# Patient Record
Sex: Male | Born: 1935 | Race: White | Hispanic: No | Marital: Married | Smoking: Former smoker
Health system: Southern US, Community
[De-identification: ages and names within clinical notes are randomized; demographics above are authoritative.]

## PROBLEM LIST (undated history)

## (undated) DIAGNOSIS — M1990 Unspecified osteoarthritis, unspecified site: Secondary | ICD-10-CM

## (undated) DIAGNOSIS — N289 Disorder of kidney and ureter, unspecified: Secondary | ICD-10-CM

## (undated) DIAGNOSIS — J449 Chronic obstructive pulmonary disease, unspecified: Secondary | ICD-10-CM

## (undated) DIAGNOSIS — E785 Hyperlipidemia, unspecified: Secondary | ICD-10-CM

## (undated) DIAGNOSIS — I10 Essential (primary) hypertension: Secondary | ICD-10-CM

## (undated) DIAGNOSIS — C329 Malignant neoplasm of larynx, unspecified: Secondary | ICD-10-CM

## (undated) HISTORY — PX: APPENDECTOMY: SHX54

## (undated) HISTORY — PX: PROSTATE SURGERY: SHX751

## (undated) HISTORY — DX: Disorder of kidney and ureter, unspecified: N28.9

## (undated) HISTORY — DX: Hyperlipidemia, unspecified: E78.5

## (undated) HISTORY — PX: TONSILLECTOMY AND ADENOIDECTOMY: SHX28

## (undated) HISTORY — DX: Unspecified osteoarthritis, unspecified site: M19.90

## (undated) HISTORY — DX: Chronic obstructive pulmonary disease, unspecified: J44.9

## (undated) HISTORY — DX: Malignant neoplasm of larynx, unspecified: C32.9

## (undated) HISTORY — PX: AMPUTATION FINGER / THUMB: SUR24

## (undated) HISTORY — DX: Essential (primary) hypertension: I10

---

## 1992-04-19 HISTORY — PX: CATARACT EXTRACTION: SUR2

## 2004-04-29 ENCOUNTER — Ambulatory Visit: Payer: Self-pay | Admitting: Internal Medicine

## 2005-04-12 ENCOUNTER — Emergency Department: Payer: Self-pay | Admitting: Emergency Medicine

## 2005-07-14 ENCOUNTER — Ambulatory Visit: Payer: Self-pay | Admitting: Internal Medicine

## 2005-08-11 ENCOUNTER — Encounter: Payer: Self-pay | Admitting: Internal Medicine

## 2005-08-16 ENCOUNTER — Ambulatory Visit: Payer: Self-pay | Admitting: Internal Medicine

## 2005-10-13 ENCOUNTER — Ambulatory Visit: Payer: Self-pay | Admitting: Internal Medicine

## 2005-10-14 ENCOUNTER — Emergency Department: Payer: Self-pay | Admitting: Unknown Physician Specialty

## 2005-10-29 ENCOUNTER — Emergency Department: Payer: Self-pay | Admitting: Urology

## 2005-11-08 ENCOUNTER — Emergency Department: Payer: Self-pay

## 2005-12-01 ENCOUNTER — Ambulatory Visit: Payer: Self-pay | Admitting: Urology

## 2005-12-01 ENCOUNTER — Other Ambulatory Visit: Payer: Self-pay

## 2005-12-06 ENCOUNTER — Ambulatory Visit: Payer: Self-pay | Admitting: Internal Medicine

## 2005-12-08 ENCOUNTER — Ambulatory Visit: Payer: Self-pay | Admitting: Urology

## 2006-02-02 ENCOUNTER — Encounter
Admission: RE | Admit: 2006-02-02 | Discharge: 2006-02-02 | Payer: Self-pay | Admitting: Physical Medicine and Rehabilitation

## 2006-02-02 ENCOUNTER — Encounter: Payer: Self-pay | Admitting: Internal Medicine

## 2006-02-15 ENCOUNTER — Ambulatory Visit: Payer: Self-pay | Admitting: Internal Medicine

## 2006-03-21 ENCOUNTER — Observation Stay (HOSPITAL_COMMUNITY): Admission: RE | Admit: 2006-03-21 | Discharge: 2006-03-21 | Payer: Self-pay | Admitting: Neurosurgery

## 2006-03-24 ENCOUNTER — Ambulatory Visit: Payer: Self-pay | Admitting: Otolaryngology

## 2006-03-25 ENCOUNTER — Ambulatory Visit: Payer: Self-pay | Admitting: Otolaryngology

## 2006-03-25 ENCOUNTER — Ambulatory Visit: Payer: Self-pay | Admitting: Internal Medicine

## 2006-03-29 ENCOUNTER — Ambulatory Visit: Payer: Self-pay | Admitting: Otolaryngology

## 2006-04-08 ENCOUNTER — Ambulatory Visit: Payer: Self-pay | Admitting: Radiation Oncology

## 2006-04-14 ENCOUNTER — Ambulatory Visit: Payer: Self-pay | Admitting: Radiation Oncology

## 2006-04-19 ENCOUNTER — Ambulatory Visit: Payer: Self-pay | Admitting: Radiation Oncology

## 2006-04-19 DIAGNOSIS — C329 Malignant neoplasm of larynx, unspecified: Secondary | ICD-10-CM

## 2006-04-19 HISTORY — DX: Malignant neoplasm of larynx, unspecified: C32.9

## 2006-04-21 ENCOUNTER — Encounter: Payer: Self-pay | Admitting: Internal Medicine

## 2006-05-20 ENCOUNTER — Ambulatory Visit: Payer: Self-pay | Admitting: Radiation Oncology

## 2006-06-18 ENCOUNTER — Ambulatory Visit: Payer: Self-pay | Admitting: Radiation Oncology

## 2006-06-18 ENCOUNTER — Ambulatory Visit: Payer: Self-pay | Admitting: Oncology

## 2006-07-19 ENCOUNTER — Ambulatory Visit: Payer: Self-pay | Admitting: Radiation Oncology

## 2006-07-19 ENCOUNTER — Ambulatory Visit: Payer: Self-pay | Admitting: Oncology

## 2006-07-27 ENCOUNTER — Encounter: Payer: Self-pay | Admitting: Internal Medicine

## 2006-07-27 DIAGNOSIS — J449 Chronic obstructive pulmonary disease, unspecified: Secondary | ICD-10-CM

## 2006-07-27 DIAGNOSIS — I10 Essential (primary) hypertension: Secondary | ICD-10-CM | POA: Insufficient documentation

## 2006-07-27 DIAGNOSIS — N259 Disorder resulting from impaired renal tubular function, unspecified: Secondary | ICD-10-CM | POA: Insufficient documentation

## 2006-07-27 DIAGNOSIS — Z87898 Personal history of other specified conditions: Secondary | ICD-10-CM

## 2006-07-27 DIAGNOSIS — J4489 Other specified chronic obstructive pulmonary disease: Secondary | ICD-10-CM | POA: Insufficient documentation

## 2006-08-17 ENCOUNTER — Ambulatory Visit: Payer: Self-pay | Admitting: Internal Medicine

## 2006-08-18 ENCOUNTER — Ambulatory Visit: Payer: Self-pay | Admitting: Oncology

## 2006-08-18 ENCOUNTER — Ambulatory Visit: Payer: Self-pay | Admitting: Radiation Oncology

## 2006-08-23 ENCOUNTER — Encounter: Payer: Self-pay | Admitting: Internal Medicine

## 2006-09-18 ENCOUNTER — Ambulatory Visit: Payer: Self-pay | Admitting: Oncology

## 2006-09-18 ENCOUNTER — Ambulatory Visit: Payer: Self-pay | Admitting: Radiation Oncology

## 2006-10-18 ENCOUNTER — Ambulatory Visit: Payer: Self-pay | Admitting: Radiation Oncology

## 2006-10-26 ENCOUNTER — Ambulatory Visit: Payer: Self-pay | Admitting: Urology

## 2006-10-28 ENCOUNTER — Ambulatory Visit: Payer: Self-pay | Admitting: Oncology

## 2006-11-07 ENCOUNTER — Encounter (INDEPENDENT_AMBULATORY_CARE_PROVIDER_SITE_OTHER): Payer: Self-pay | Admitting: *Deleted

## 2006-11-18 ENCOUNTER — Ambulatory Visit: Payer: Self-pay | Admitting: Radiation Oncology

## 2006-12-09 ENCOUNTER — Ambulatory Visit: Payer: Self-pay | Admitting: Oncology

## 2006-12-14 ENCOUNTER — Encounter (INDEPENDENT_AMBULATORY_CARE_PROVIDER_SITE_OTHER): Payer: Self-pay | Admitting: *Deleted

## 2006-12-14 ENCOUNTER — Ambulatory Visit: Payer: Self-pay | Admitting: Oncology

## 2006-12-19 ENCOUNTER — Ambulatory Visit: Payer: Self-pay | Admitting: Oncology

## 2006-12-19 ENCOUNTER — Ambulatory Visit: Payer: Self-pay | Admitting: Radiation Oncology

## 2007-01-04 ENCOUNTER — Ambulatory Visit: Payer: Self-pay | Admitting: Internal Medicine

## 2007-01-04 DIAGNOSIS — M1990 Unspecified osteoarthritis, unspecified site: Secondary | ICD-10-CM | POA: Insufficient documentation

## 2007-01-04 DIAGNOSIS — E785 Hyperlipidemia, unspecified: Secondary | ICD-10-CM

## 2007-01-10 ENCOUNTER — Encounter: Payer: Self-pay | Admitting: Internal Medicine

## 2007-01-18 HISTORY — PX: LARYNGECTOMY: SUR815

## 2007-01-23 ENCOUNTER — Telehealth: Payer: Self-pay | Admitting: Internal Medicine

## 2007-02-05 ENCOUNTER — Encounter (INDEPENDENT_AMBULATORY_CARE_PROVIDER_SITE_OTHER): Payer: Self-pay | Admitting: *Deleted

## 2007-02-17 ENCOUNTER — Encounter: Payer: Self-pay | Admitting: Internal Medicine

## 2007-02-18 ENCOUNTER — Ambulatory Visit: Payer: Self-pay | Admitting: Oncology

## 2007-02-26 ENCOUNTER — Ambulatory Visit: Payer: Self-pay | Admitting: Family Medicine

## 2007-03-08 ENCOUNTER — Ambulatory Visit: Payer: Self-pay | Admitting: Oncology

## 2007-03-20 ENCOUNTER — Ambulatory Visit: Payer: Self-pay | Admitting: Oncology

## 2007-03-29 ENCOUNTER — Encounter: Payer: Self-pay | Admitting: Internal Medicine

## 2007-04-07 ENCOUNTER — Ambulatory Visit: Payer: Self-pay | Admitting: Internal Medicine

## 2007-04-19 ENCOUNTER — Encounter: Payer: Self-pay | Admitting: Internal Medicine

## 2007-05-04 ENCOUNTER — Telehealth (INDEPENDENT_AMBULATORY_CARE_PROVIDER_SITE_OTHER): Payer: Self-pay | Admitting: *Deleted

## 2007-07-07 ENCOUNTER — Ambulatory Visit: Payer: Self-pay | Admitting: Internal Medicine

## 2007-07-07 DIAGNOSIS — R32 Unspecified urinary incontinence: Secondary | ICD-10-CM

## 2007-07-11 LAB — CONVERTED CEMR LAB
Basophils Relative: 0.9 % (ref 0.0–1.0)
CO2: 29 meq/L (ref 19–32)
Creatinine, Ser: 1.3 mg/dL (ref 0.4–1.5)
Eosinophils Relative: 2.1 % (ref 0.0–5.0)
HCT: 39.8 % (ref 39.0–52.0)
Hemoglobin: 13.2 g/dL (ref 13.0–17.0)
Lymphocytes Relative: 13.4 % (ref 12.0–46.0)
Monocytes Absolute: 0.5 10*3/uL (ref 0.2–0.7)
Neutro Abs: 3.7 10*3/uL (ref 1.4–7.7)
Phosphorus: 3.9 mg/dL (ref 2.3–4.6)
Sodium: 133 meq/L — ABNORMAL LOW (ref 135–145)
TSH: 3.77 microintl units/mL (ref 0.35–5.50)
WBC: 5 10*3/uL (ref 4.5–10.5)

## 2007-07-19 ENCOUNTER — Ambulatory Visit: Payer: Self-pay | Admitting: Oncology

## 2007-07-21 ENCOUNTER — Encounter (INDEPENDENT_AMBULATORY_CARE_PROVIDER_SITE_OTHER): Payer: Self-pay | Admitting: *Deleted

## 2007-08-22 ENCOUNTER — Telehealth (INDEPENDENT_AMBULATORY_CARE_PROVIDER_SITE_OTHER): Payer: Self-pay | Admitting: *Deleted

## 2007-08-23 ENCOUNTER — Encounter: Payer: Self-pay | Admitting: Internal Medicine

## 2007-09-27 ENCOUNTER — Telehealth: Payer: Self-pay | Admitting: Internal Medicine

## 2007-10-03 ENCOUNTER — Encounter: Payer: Self-pay | Admitting: Internal Medicine

## 2007-11-02 ENCOUNTER — Telehealth (INDEPENDENT_AMBULATORY_CARE_PROVIDER_SITE_OTHER): Payer: Self-pay | Admitting: *Deleted

## 2007-11-10 ENCOUNTER — Ambulatory Visit: Payer: Self-pay | Admitting: Internal Medicine

## 2007-11-24 ENCOUNTER — Telehealth (INDEPENDENT_AMBULATORY_CARE_PROVIDER_SITE_OTHER): Payer: Self-pay | Admitting: *Deleted

## 2008-01-03 ENCOUNTER — Telehealth: Payer: Self-pay | Admitting: Internal Medicine

## 2008-01-09 ENCOUNTER — Encounter: Payer: Self-pay | Admitting: Internal Medicine

## 2008-02-13 ENCOUNTER — Telehealth: Payer: Self-pay | Admitting: Internal Medicine

## 2008-03-11 ENCOUNTER — Telehealth: Payer: Self-pay | Admitting: Internal Medicine

## 2008-04-01 ENCOUNTER — Telehealth: Payer: Self-pay | Admitting: Internal Medicine

## 2008-04-18 ENCOUNTER — Ambulatory Visit: Payer: Self-pay | Admitting: Family Medicine

## 2008-04-18 DIAGNOSIS — J019 Acute sinusitis, unspecified: Secondary | ICD-10-CM

## 2008-04-22 ENCOUNTER — Telehealth: Payer: Self-pay | Admitting: Family Medicine

## 2008-05-16 ENCOUNTER — Ambulatory Visit: Payer: Self-pay | Admitting: Internal Medicine

## 2008-05-16 DIAGNOSIS — R930 Abnormal findings on diagnostic imaging of skull and head, not elsewhere classified: Secondary | ICD-10-CM | POA: Insufficient documentation

## 2008-06-10 ENCOUNTER — Telehealth: Payer: Self-pay | Admitting: Internal Medicine

## 2008-06-21 ENCOUNTER — Ambulatory Visit: Payer: Self-pay | Admitting: Internal Medicine

## 2008-07-10 ENCOUNTER — Telehealth: Payer: Self-pay | Admitting: Internal Medicine

## 2008-08-09 ENCOUNTER — Telehealth: Payer: Self-pay | Admitting: Internal Medicine

## 2008-08-15 ENCOUNTER — Ambulatory Visit: Payer: Self-pay | Admitting: Internal Medicine

## 2008-08-16 LAB — CONVERTED CEMR LAB
ALT: 17 units/L (ref 0–53)
AST: 24 units/L (ref 0–37)
Albumin: 3.9 g/dL (ref 3.5–5.2)
BUN: 17 mg/dL (ref 6–23)
CO2: 29 meq/L (ref 19–32)
Chloride: 103 meq/L (ref 96–112)
Eosinophils Relative: 4.2 % (ref 0.0–5.0)
HCT: 38.8 % — ABNORMAL LOW (ref 39.0–52.0)
Hemoglobin: 13.3 g/dL (ref 13.0–17.0)
Lymphs Abs: 0.6 10*3/uL — ABNORMAL LOW (ref 0.7–4.0)
MCV: 95.8 fL (ref 78.0–100.0)
Monocytes Absolute: 0.1 10*3/uL (ref 0.1–1.0)
Neutro Abs: 3.3 10*3/uL (ref 1.4–7.7)
Platelets: 161 10*3/uL (ref 150.0–400.0)
Potassium: 3.9 meq/L (ref 3.5–5.1)
TSH: 1.64 microintl units/mL (ref 0.35–5.50)
Total Bilirubin: 0.7 mg/dL (ref 0.3–1.2)
Total Protein: 6.6 g/dL (ref 6.0–8.3)
WBC: 4.2 10*3/uL — ABNORMAL LOW (ref 4.5–10.5)

## 2008-09-23 ENCOUNTER — Telehealth: Payer: Self-pay | Admitting: Internal Medicine

## 2008-10-22 ENCOUNTER — Telehealth: Payer: Self-pay | Admitting: Internal Medicine

## 2008-11-26 ENCOUNTER — Telehealth: Payer: Self-pay | Admitting: Internal Medicine

## 2008-12-26 ENCOUNTER — Ambulatory Visit: Payer: Self-pay | Admitting: Internal Medicine

## 2009-01-14 ENCOUNTER — Encounter: Payer: Self-pay | Admitting: Internal Medicine

## 2009-01-21 ENCOUNTER — Telehealth: Payer: Self-pay | Admitting: Internal Medicine

## 2009-01-30 ENCOUNTER — Encounter: Payer: Self-pay | Admitting: Internal Medicine

## 2009-02-18 ENCOUNTER — Telehealth: Payer: Self-pay | Admitting: Internal Medicine

## 2009-03-19 ENCOUNTER — Telehealth: Payer: Self-pay | Admitting: Internal Medicine

## 2009-04-15 ENCOUNTER — Telehealth: Payer: Self-pay | Admitting: Internal Medicine

## 2009-05-05 ENCOUNTER — Ambulatory Visit: Payer: Self-pay | Admitting: Internal Medicine

## 2009-05-13 ENCOUNTER — Telehealth: Payer: Self-pay | Admitting: Internal Medicine

## 2009-06-04 ENCOUNTER — Telehealth: Payer: Self-pay | Admitting: Internal Medicine

## 2009-06-25 ENCOUNTER — Telehealth: Payer: Self-pay | Admitting: Family Medicine

## 2009-07-09 ENCOUNTER — Telehealth: Payer: Self-pay | Admitting: Internal Medicine

## 2009-07-14 ENCOUNTER — Telehealth: Payer: Self-pay | Admitting: Internal Medicine

## 2009-08-14 ENCOUNTER — Telehealth: Payer: Self-pay | Admitting: Internal Medicine

## 2009-09-12 ENCOUNTER — Telehealth (INDEPENDENT_AMBULATORY_CARE_PROVIDER_SITE_OTHER): Payer: Self-pay | Admitting: *Deleted

## 2009-09-16 ENCOUNTER — Ambulatory Visit: Payer: Self-pay | Admitting: Internal Medicine

## 2009-09-17 LAB — CONVERTED CEMR LAB
Basophils Relative: 0.7 % (ref 0.0–3.0)
Bilirubin, Direct: 0.1 mg/dL (ref 0.0–0.3)
Calcium: 9 mg/dL (ref 8.4–10.5)
Creatinine, Ser: 1.9 mg/dL — ABNORMAL HIGH (ref 0.4–1.5)
Eosinophils Absolute: 0.1 10*3/uL (ref 0.0–0.7)
Eosinophils Relative: 2.5 % (ref 0.0–5.0)
GFR calc non Af Amer: 37.39 mL/min (ref >60)
Glucose, Bld: 78 mg/dL (ref 70–99)
Hemoglobin: 12.7 g/dL — ABNORMAL LOW (ref 13.0–17.0)
MCHC: 34.7 g/dL (ref 30.0–36.0)
MCV: 94 fL (ref 78.0–100.0)
Monocytes Absolute: 0.5 10*3/uL (ref 0.1–1.0)
Neutro Abs: 3.9 10*3/uL (ref 1.4–7.7)
Neutrophils Relative %: 73 % (ref 43.0–77.0)
Phosphorus: 3.5 mg/dL (ref 2.3–4.6)
Potassium: 4.6 meq/L (ref 3.5–5.1)
RBC: 3.89 M/uL — ABNORMAL LOW (ref 4.22–5.81)
Sodium: 138 meq/L (ref 135–145)
Total Bilirubin: 0.4 mg/dL (ref 0.3–1.2)
WBC: 5.4 10*3/uL (ref 4.5–10.5)

## 2009-09-29 ENCOUNTER — Telehealth: Payer: Self-pay | Admitting: Internal Medicine

## 2009-10-31 ENCOUNTER — Telehealth: Payer: Self-pay | Admitting: Internal Medicine

## 2010-01-19 ENCOUNTER — Ambulatory Visit: Payer: Self-pay | Admitting: Internal Medicine

## 2010-01-19 DIAGNOSIS — F4321 Adjustment disorder with depressed mood: Secondary | ICD-10-CM

## 2010-05-19 NOTE — Progress Notes (Signed)
Summary: PERCOCET  Phone Note Refill Request Call back at Home Phone [phone] Message from:  Patient on July 14, 2009 11:35 AM  Refills Requested: Medication #1:  PERCOCET 5-325 MG  TABS 1-2 three times a day as needed for severe back pain pt's wife has called several times on friday and today wanting the rx for this. I had advised her that the pt was too early, but she just wanted to make sure that we would refill it? She said that "she" needs it, i'm not sure if she meant the patient or if she was taking it herself?   Method Requested: Pick up at Office Initial call taken by: Mervin Hack CMA [facility]ull),  July 14, 2009 11:36 AM  Follow-up for Phone Call        I am concerned about the amount being used I will refill #120 now but I expect this to last a month. If he needs more, I will need to refer him to pain management specialist Follow-up by: Cindee Salt MD,  July 14, 2009 2:10 PM  Additional Follow-up for Phone Call Additional follow up Details #1::        spoke with wife and she understands. Additional Follow-up by: Mervin Hack CMA [facility]),  July 14, 2009 2:42 PM    Prescriptions: PERCOCET 5-325 MG  TABS (OXYCODONE-ACETAMINOPHEN) 1-2 three times a day as needed for severe back pain  #120 x 0   Entered and Authorized by:   Cindee Salt MD   Signed by:   Cindee Salt MD on 07/14/2009   Method used:   Print then Give to Patient   RxID:   [id number]

## 2010-05-19 NOTE — Miscellaneous (Signed)
Summary: Controlled Substance Agreement  Controlled Substance Agreement   Imported By: Lanelle Bal 01/26/2010 09:06:34  _____________________________________________________________________  External Attachment:    Type:   Image     Comment:   External Document

## 2010-05-19 NOTE — Progress Notes (Signed)
Summary: refill request for percocet  Phone Note Refill Request Call back at Home Phone [phone] Message from:  wife  Refills Requested: Medication #1:  PERCOCET 5-325 MG  TABS 1-2 three times a day as needed for severe back pain Please call wife when ready.  Initial call taken by: Lowella Petties CMA,  June 25, 2009 8:13 AM  Follow-up for Phone Call        Patient advised rx ready for pick up Follow-up by: Benny Lennert CMA [facility]),  June 25, 2009 11:13 AM    Prescriptions: PERCOCET 5-325 MG  TABS (OXYCODONE-ACETAMINOPHEN) 1-2 three times a day as needed for severe back pain  #120 x 0   Entered and Authorized by:   Hannah Beat MD   Signed by:   Hannah Beat MD on 06/25/2009   Method used:   Print then Give to Patient   RxID:   [id number]

## 2010-05-19 NOTE — Progress Notes (Signed)
Summary: needs refill on percocet  Phone Note Refill Request Call back at Home Phone [phone] Message from:  wife  Refills Requested: Medication #1:  PERCOCET 5-325 MG  TABS 1-2 three times a day as needed for severe back pain Please call when ready.  Initial call taken by: Lowella Petties CMA,  July 09, 2009 8:24 AM  Follow-up for Phone Call        At least 20 days supply done on 3/9 Please check about what happened Follow-up by: Cindee Salt MD,  July 09, 2009 10:55 AM  Additional Follow-up for Phone Call Additional follow up Details #1::        spoke with wife and she states pt is hurting a bit more and taking 2 by mouth three times a day, please advise DeShannon Katrinka Blazing CMA [facility])  July 09, 2009 11:10 AM   Yes, that is the maximum that he is allowed but the #120 Rx from March 9th shouldn't be out till next week Cindee Salt MD  July 09, 2009 11:44 AM   spoke with wife again and she will call back next week to have refill done. Additional Follow-up by: Mervin Hack CMA [facility]),  July 09, 2009 2:32 PM

## 2010-05-19 NOTE — Progress Notes (Signed)
Summary: Percocet rx  Phone Note Refill Request Call back at [phone] Message from:  Patient's spouse on May 13, 2009 11:01 AM  Refills Requested: Medication #1:  PERCOCET 5-325 MG  TABS 1-2 three times a day as needed for severe back pain Pt's wife request written rx for Percocet 5-325mg  for back pain. Please call pt's wife [phone] when rx is ready. Please advise.    Method Requested: Pick up at Office Initial call taken by: Lewanda Rife LPN,  May 13, 2009 11:01 AM Caller: Spouse Call For: Harold Salt MD Summary of Call: Pt needs written rx for   Follow-up for Phone Call        Rx written Follow-up by: Harold Salt MD,  May 13, 2009 2:08 PM  Additional Follow-up for Phone Call Additional follow up Details #1::        Spoke with patient and advised rx ready for pick-up  Additional Follow-up by: Mervin Hack CMA [facility]),  May 13, 2009 2:29 PM    Prescriptions: PERCOCET 5-325 MG  TABS (OXYCODONE-ACETAMINOPHEN) 1-2 three times a day as needed for severe back pain  #120 x 0   Entered and Authorized by:   Harold Salt MD   Signed by:   Harold Salt MD on 05/13/2009   Method used:   Print then Give to Patient   RxID:   [id number]

## 2010-05-19 NOTE — Progress Notes (Signed)
Summary: refill request for percocet  Phone Note Refill Request Call back at Home Phone [phone] Message from:  wife  Refills Requested: Medication #1:  PERCOCET 5-325 MG  TABS 1-2 three times a day as needed for severe back pain Please call wife when ready.  Initial call taken by: Lowella Petties CMA,  August 14, 2009 10:46 AM  Follow-up for Phone Call        Rx written Follow-up by: Cindee Salt MD,  August 14, 2009 1:16 PM  Additional Follow-up for Phone Call Additional follow up Details #1::        Spoke with patient and advised rx ready for pick-up  Additional Follow-up by: Mervin Hack CMA [facility]),  August 14, 2009 3:13 PM    Prescriptions: PERCOCET 5-325 MG  TABS (OXYCODONE-ACETAMINOPHEN) 1-2 three times a day as needed for severe back pain  #120 x 0   Entered and Authorized by:   Cindee Salt MD   Signed by:   Cindee Salt MD on 08/14/2009   Method used:   Print then Give to Patient   RxID:   [id number]

## 2010-05-19 NOTE — Assessment & Plan Note (Signed)
Summary: 4 month follow up/[facility]   Vital Signs:  Patient profile:   77 year old male Weight:      160 pounds Temp:     98.3 degrees F oral Pulse rate:   72 / minute Pulse rhythm:   regular BP sitting:   100 / 60  (left arm) Cuff size:   large  Vitals Entered By: Mervin Hack CMA [facility]) (Sep 16, 2009 11:29 AM) CC: 4 month follow-up   History of Present Illness: Doing fairly well  discussed his pain control Offerred fentanyl patch for more level pain ocntrol he is generally satisfied with the percocoet Notes recent back strain trying to move box so he needed more meds  doesn't check blood pressure no headaches No chest pain No edmea  No SOB No cough does get sputum to clear at times--may be yellow first thing in the AM but then clear after  No trouble voiding Nocturia x 2-3---uses urinal  Allergies: 1)  ! * Detrol  Past History:  Past medical, surgical, family and social histories (including risk factors) reviewed for relevance to current acute and chronic problems.  Past Medical History: Reviewed history from 07/07/2007 and no changes required. COPD Hypertension Renal insufficiency Laryngeal cancer--1/08 Hyperlipidemia Osteoarthritis Urinary incontinence  Past Surgical History: Reviewed history from 07/07/2007 and no changes required. 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 Dad died @82 --Altzheimers Mom died @78 --fell and broke hip Brother died @68  lung cancer CAD in uncle HTN in family No  prostate or colon cancer  Social History: Reviewed history from 07/27/2006 and no changes required. Retired--environmental health researcher Married--3 children Current Smoker Alcohol use-no Likes  reading and gardening  Review of Systems       sleeps well despite nocturia appetite is very good weight is stable  Physical Exam  General:  alert and normal appearance.   Neck:  supple, no masses, no thyromegaly, no carotid bruits, and no cervical lymphadenopathy.   Lungs:  normal respiratory effort, no intercostal retractions, and no accessory muscle Decreased breath sounds but clear Heart:  normal rate, regular rhythm, no murmur, and no gallop.   Extremities:  no edema Skin:  multiple seb keratoses on back Psych:  normally interactive, good eye contact, not anxious appearing, and not depressed appearing.     Impression & Recommendations:  Problem # 1:  OSTEOARTHRITIS (ICD-715.90) Assessment Unchanged doing well on this prefers to stick with this for now  His updated medication list for this problem includes:    Percocet 5-325 Mg Tabs (Oxycodone-acetaminophen) .Marland Kitchen... 1-2 three times a day as needed for severe back pain  Problem # 2:  HYPERTENSION (ICD-401.9) Assessment: Unchanged  good control due for labs  His updated medication list for this problem includes:    Ziac 5-6.25 Mg Tabs (Bisoprolol-hydrochlorothiazide) .Marland Kitchen... Take 1 tablet by mouth once a day    Lisinopril 20 Mg Tabs (Lisinopril) .Marland Kitchen... Take 1 tablet by mouth once a day  BP today: 100/60 Prior BP: 148/80 (05/05/2009)  Labs Reviewed: K+: 3.9 (08/15/2008) Creat: : 1.2 (08/15/2008)     Orders: Venipuncture (60454) TLB-Renal Function Panel (80069-RENAL) TLB-Hepatic/Liver Function Pnl (80076-HEPATIC) TLB-TSH (Thyroid Stimulating Hormone) (84443-TSH) TLB-CBC Platelet - w/Differential (85025-CBCD)  Problem # 3:  BENIGN PROSTATIC HYPERTROPHY, HX OF (ICD-V13.8) Assessment: Unchanged voiding okay without meds  Problem # 4:  COPD (ICD-496) Assessment: Unchanged resp status okay without meds  Complete Medication List: 1)  Percocet 5-325 Mg Tabs (Oxycodone-acetaminophen) .Marland Kitchen.. 1-2 three times a day as  needed for severe back pain 2)  Ziac 5-6.25 Mg Tabs (Bisoprolol-hydrochlorothiazide) .... Take 1 tablet by mouth once a day 3)  Lisinopril 20 Mg Tabs (Lisinopril) .... Take 1 tablet by mouth once a day  Patient Instructions: 1)  Please schedule a follow-up appointment in 4 months .   Current Allergies (reviewed today): ! * DETROL

## 2010-05-19 NOTE — Assessment & Plan Note (Signed)
Summary: ROA 4 MTHS CYD   Vital Signs:  Patient profile:   77 year old male Weight:      161.50 pounds BMI:     25.01 Temp:     98.0 degrees F oral Pulse rate:   64 / minute Pulse rhythm:   regular BP sitting:   148 / 80  (left arm) Cuff size:   regular  Vitals Entered By: Linde Gillis CMA [facility]) (May 05, 2009 12:26 PM) CC: four month follow up   History of Present Illness: Did have PET scan again Hasn't gone back to oncologist again since then Still lit up but apparently no change over a year  No other new concerns  Still satisfied with pain control uses oxycodone two times a day and occ three times a day   Doesn't check BP No chest pain No palpitations Breathing is fine  uses laryngeal amplifier still No evidence of recurrence  Allergies: 1)  ! * Detrol  Past History:  Past medical, surgical, family and social histories (including risk factors) reviewed for relevance to current acute and chronic problems.  Past Medical History: Reviewed history from 07/07/2007 and no changes required. COPD Hypertension Renal insufficiency Laryngeal cancer--1/08 Hyperlipidemia Osteoarthritis Urinary incontinence  Past Surgical History: Reviewed history from 07/07/2007 and no changes required. 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 Dad died @82 --Altzheimers Mom died @78 --fell and broke hip Brother died @68  lung cancer CAD in uncle HTN in family No  prostate or colon cancer  Social History: Reviewed history from 07/27/2006 and no changes required. Retired--environmental health researcher Married--3 children Current Smoker Alcohol use-no Likes reading and gardening  Review of Systems   appetite is fine weight down 2# Sleeps fine Nocturia x 3 but voids okay in day  Physical Exam  General:  alert.  NAD Neck:  same indurated areas without nodes Trach covered with bandage Lungs:  normal respiratory effort, no intercostal retractions, and no accessory muscle use.  Clear but referred upper airway sounds throughout Heart:  normal rate, regular rhythm, no murmur, and no gallop.   Extremities:  no sig edema Psych:  normally interactive, good eye contact, not anxious appearing, and not depressed appearing.     Impression & Recommendations:  Problem # 1:  NONSPCIFC ABN FINDING RAD & OTH EXAM LUNG FIELD (ICD-793.1) Assessment Comment Only apparently had not changed much on PET scan no action should be taken then  Problem # 2:  HYPERTENSION (ICD-401.9) Assessment: Unchanged reasonable control no changes needed  His updated medication list for this problem includes:    Ziac 5-6.25 Mg Tabs (Bisoprolol-hydrochlorothiazide) .Marland Kitchen... Take 1 tablet by mouth once a day    Lisinopril 20 Mg Tabs (Lisinopril) .Marland Kitchen... Take 1 tablet by mouth once a day  BP today: 148/80 Prior BP: 120/80 (12/26/2008)  Labs Reviewed: K+: 3.9 (08/15/2008) Creat: : 1.2 (08/15/2008)     Problem # 3:  OSTEOARTHRITIS (ICD-715.90) Assessment: Unchanged okay on meds  His updated medication list for this problem includes:    Percocet 5-325 Mg Tabs (Oxycodone-acetaminophen) .Marland Kitchen... 1-2 three times a day as needed for severe back pain  Problem # 4:  COPD (ICD-496) Assessment: Comment Only stable resp status  Complete Medication List: 1)  Percocet 5-325 Mg Tabs (Oxycodone-acetaminophen) .Marland Kitchen.. 1-2 three times a day as  needed for severe back pain 2)  Temazepam 7.5 Mg Caps (Temazepam) .Marland Kitchen.. 1-2 at bedtime as needed to help sleep 3)  Ziac 5-6.25 Mg Tabs (Bisoprolol-hydrochlorothiazide) .... Take 1 tablet by mouth once a day 4)  Lisinopril 20 Mg Tabs (Lisinopril) .... Take 1 tablet by mouth once a day  Patient  Instructions: 1)  Please schedule a follow-up appointment in 4 months .   Current Allergies (reviewed today): ! * DETROL

## 2010-05-19 NOTE — Assessment & Plan Note (Signed)
Summary: 4 MONTH FOLLOW UP/[facility]   Vital Signs:  Patient profile:   77 year old male Weight:      158 pounds Temp:     98.3 degrees F oral Pulse rate:   62 / minute Pulse rhythm:   regular BP sitting:   122 / 62  (left arm) Cuff size:   large  Vitals Entered By: Mervin Hack CMA [facility]) (January 19, 2010 12:11 PM) CC: 4 month follow-up   History of Present Illness: Wife died recently "I am doing okay" Chiildren are nearby--they call regularly and visit as often as possible Lives alone Does cook--he did in the past also at times Close to 25th anniversary Considering moving to retirement center where they have some support (like one meal a day) Has 2 cats at home  satisfied with pain control  No chest pain  No SOB   Allergies: 1)  ! * Detrol  Past History:  Past medical, surgical, family and social histories (including risk factors) reviewed for relevance to current acute and chronic problems.  Past Medical History: Reviewed history from 07/07/2007 and no changes required. COPD Hypertension Renal insufficiency Laryngeal cancer--1/08 Hyperlipidemia Osteoarthritis Urinary incontinence  Past Surgical History: Reviewed history from 07/07/2007 and no changes required. 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 Dad died @82 --Altzheimers Mom died @78 --fell and broke hip Brother died @68  lung cancer CAD in uncle HTN in family No  prostate or colon cancer  Social History: Retired--environmental Chief Executive Officer Widowed 2011 3 children Former  Smoker Alcohol use-no Likes reading and gardening  Review of Systems       sleeps okay appetite is okay weight down 2#  Physical Exam  General:  alert.   NAD Mouth:  speaks with the electronic voicebox as usual Neck:  supple, no masses, and no thyromegaly.   Lungs:  normal respiratory effort, no intercostal retractions, and no accessory muscle use.  Decreased breath sounds but clear Heart:  normal rate, regular rhythm, no murmur, and no gallop.   Extremities:  no edema Psych:  normally interactive, good eye contact, and not anxious appearing.  Upset and occ mild crying    Impression & Recommendations:  Problem # 1:  GRIEF REACTION (ICD-309.0) Assessment New grieving for wife "she was my whole life" seems to be doing okay has family support  Problem # 2:  OSTEOARTHRITIS (ICD-715.90) Assessment: Unchanged doing okay with the current med Hasn't needed as much of the oxycodone lately  His updated medication list for this problem includes:    Percocet 5-325 Mg Tabs (Oxycodone-acetaminophen) .Marland Kitchen... 1-2 three times a day as needed for severe back pain  Problem # 3:  HYPERTENSION (ICD-401.9) Assessment: Unchanged okay on the medication  His updated medication list for this problem includes:    Ziac 5-6.25 Mg Tabs (Bisoprolol-hydrochlorothiazide) .Marland Kitchen... Take 1 tablet by mouth once a day    Lisinopril 20 Mg Tabs (Lisinopril) .Marland Kitchen... Take 1 tablet by mouth once a day  BP today: 122/62 Prior BP: 100/60 (09/16/2009)  Labs Reviewed: K+: 4.6 (09/16/2009) Creat: : 1.9 (09/16/2009)     Problem # 4:  COPD (ICD-496) Assessment: Unchanged stable resp status since stopped smoking  Complete Medication List: 1)  Percocet 5-325 Mg Tabs (Oxycodone-acetaminophen) .Marland Kitchen.. 1-2 three times a day as needed for severe  back pain 2)  Ziac 5-6.25 Mg Tabs (Bisoprolol-hydrochlorothiazide) .... Take 1 tablet by mouth once a day 3)  Lisinopril 20 Mg Tabs (Lisinopril) .... Take 1 tablet by mouth once a day  Patient Instructions: 1)  Please schedule a follow-up appointment in 4 months .  Prescriptions: PERCOCET 5-325 MG  TABS (OXYCODONE-ACETAMINOPHEN) 1-2  three times a day as needed for severe back pain  #120 x 0   Entered and Authorized by:   Cindee Salt MD   Signed by:   Cindee Salt MD on 01/19/2010   Method used:   Print then Give to Patient   RxID:   [id number]   Current Allergies (reviewed today): ! * DETROL  Appended Document: 4 MONTH FOLLOW UP/[facility]    Clinical Lists Changes  Orders: Added new Service order of Flu Vaccine 52yrs + MEDICARE PATIENTS (J4782) - Signed Added new Service order of Administration Flu vaccine - MCR (N5621) - Signed Observations: Added new observation of FLU VAX#1VIS: 11/11/09 version given January 19, 2010. (01/19/2010 13:03) Added new observation of FLU VAXLOT: [id number] (01/19/2010 13:03) Added new observation of FLU VAX EXP: 10/17/2010 (01/19/2010 13:03) Added new observation of FLU VAXBY: DeShannon Smith CMA (AAMA) (01/19/2010 13:03) Added new observation of FLU VAXRTE: IM (01/19/2010 13:03) Added new observation of FLU VAX DSE: 0.5 ml (01/19/2010 13:03) Added new observation of FLU VAXMFR: [employer] (01/19/2010 13:03) Added new observation of FLU VAX SITE: right deltoid (01/19/2010 13:03) Added new observation of FLU VAX: Fluvax MCR (01/19/2010 13:03)       Influenza Vaccine    Vaccine Type: Fluvax MCR    Site: right deltoid    Mfr: [employer]oSmith[employer]line    Dose: 0.5 ml    Route: IM    Given by: Mervin Hack CMA (AAMA)    Exp. Date: 10/17/2010    Lot #: [id number]    VIS given: 11/11/09 version given January 19, 2010.  Flu Vaccine Consent Questions    Do you have a history of severe allergic reactions to this vaccine? no    Any prior history of allergic reactions to egg and/or gelatin? no    Do you have a sensitivity to the preservative Thimersol? no    Do you have a past history of Guillan-Barre Syndrome? no    Do you currently have an acute febrile illness? no    Have you ever had a severe reaction to latex? no    Vaccine information given and explained to  patient? yes

## 2010-05-19 NOTE — Progress Notes (Signed)
Summary: percocet  Phone Note Refill Request Call back at Home Phone [phone] Message from:  Patient on Sep 12, 2009 10:48 AM  Refills Requested: Medication #1:  PERCOCET 5-325 MG  TABS 1-2 three times a day as needed for severe back pain  Method Requested: Pick up at Office Initial call taken by: Melody Comas,  Sep 12, 2009 10:47 AM  Follow-up for Phone Call        Rx written Follow-up by: Cindee Salt MD,  Sep 12, 2009 12:25 PM  Additional Follow-up for Phone Call Additional follow up Details #1::        Patient Advised. Prescription left at front desk.  Additional Follow-up by: Delilah Shan CMA [facility]),  Sep 12, 2009 1:02 PM    Prescriptions: PERCOCET 5-325 MG  TABS (OXYCODONE-ACETAMINOPHEN) 1-2 three times a day as needed for severe back pain  #120 x 0   Entered and Authorized by:   Cindee Salt MD   Signed by:   Cindee Salt MD on 09/12/2009   Method used:   Print then Give to Patient   RxID:   [id number]

## 2010-05-19 NOTE — Progress Notes (Signed)
Summary: Wife passed  Phone Note Call from Patient Call back at Home Phone [phone]   Caller: Patient Call For: Cindee Salt MD Summary of Call: FYI- patient just called stating that his wife just passed, and that he would be contacting you later. Initial call taken by: Mervin Hack CMA [facility]),  September 29, 2009 9:37 AM  Follow-up for Phone Call        called to express my condolences he has a good bit of family around funeral was last weekend  he will call if he is having troubles that need attention Follow-up by: Cindee Salt MD,  September 30, 2009 2:18 PM

## 2010-05-19 NOTE — Progress Notes (Signed)
Summary: refill request for percocet  Phone Note Refill Request Call back at Home Phone [phone] Message from:  Patient  Refills Requested: Medication #1:  PERCOCET 5-325 MG  TABS 1-2 three times a day as needed for severe back pain Please call pt when ready, he knows it wont be until monday and he is ok with that.  Initial call taken by: Lowella Petties CMA,  October 31, 2009 10:05 AM  Follow-up for Phone Call        Rx written Follow-up by: Cindee Salt MD,  November 03, 2009 12:23 PM  Additional Follow-up for Phone Call Additional follow up Details #1::         Advised pt ok to pick up script.        Additional Follow-up by: Lowella Petties CMA,  November 03, 2009 1:12 PM    Prescriptions: PERCOCET 5-325 MG  TABS (OXYCODONE-ACETAMINOPHEN) 1-2 three times a day as needed for severe back pain  #120 x 0   Entered and Authorized by:   Cindee Salt MD   Signed by:   Cindee Salt MD on 11/03/2009   Method used:   Print then Give to Patient   RxID:   [id number]

## 2010-05-19 NOTE — Progress Notes (Signed)
Summary: refill request for percocet  Phone Note Refill Request Call back at Home Phone [phone] Message from:  wife  Refills Requested: Medication #1:  PERCOCET 5-325 MG  TABS 1-2 three times a day as needed for severe back pain Please call when ready.  Initial call taken by: Lowella Petties CMA,  June 04, 2009 12:38 PM  Follow-up for Phone Call        Rx written  may need to consider long acting agent since he is requiring so much meds. Will discuss at next visit Follow-up by: Cindee Salt MD,  June 04, 2009 2:17 PM  Additional Follow-up for Phone Call Additional follow up Details #1::        Spoke to spouse, Rx ready for pick up. Additional Follow-up by: Linde Gillis CMA [facility]),  June 04, 2009 2:47 PM    Prescriptions: PERCOCET 5-325 MG  TABS (OXYCODONE-ACETAMINOPHEN) 1-2 three times a day as needed for severe back pain  #120 x 0   Entered and Authorized by:   Cindee Salt MD   Signed by:   Cindee Salt MD on 06/04/2009   Method used:   Print then Give to Patient   RxID:   [id number]

## 2010-05-25 ENCOUNTER — Other Ambulatory Visit: Payer: Self-pay | Admitting: Internal Medicine

## 2010-05-25 ENCOUNTER — Ambulatory Visit: Payer: Self-pay | Admitting: Internal Medicine

## 2010-05-25 ENCOUNTER — Encounter: Payer: Self-pay | Admitting: Internal Medicine

## 2010-05-25 ENCOUNTER — Ambulatory Visit (INDEPENDENT_AMBULATORY_CARE_PROVIDER_SITE_OTHER): Payer: Medicare Other | Admitting: Internal Medicine

## 2010-05-25 DIAGNOSIS — I10 Essential (primary) hypertension: Secondary | ICD-10-CM

## 2010-05-25 DIAGNOSIS — F4321 Adjustment disorder with depressed mood: Secondary | ICD-10-CM

## 2010-05-25 DIAGNOSIS — M1990 Unspecified osteoarthritis, unspecified site: Secondary | ICD-10-CM

## 2010-05-25 DIAGNOSIS — J449 Chronic obstructive pulmonary disease, unspecified: Secondary | ICD-10-CM

## 2010-05-25 LAB — CBC WITH DIFFERENTIAL/PLATELET
Basophils Absolute: 0 10*3/uL (ref 0.0–0.1)
Basophils Relative: 0.5 % (ref 0.0–3.0)
Eosinophils Absolute: 0.1 10*3/uL (ref 0.0–0.7)
Hemoglobin: 13.3 g/dL (ref 13.0–17.0)
MCHC: 34.9 g/dL (ref 30.0–36.0)
MCV: 95.7 fl (ref 78.0–100.0)
Monocytes Absolute: 0.6 10*3/uL (ref 0.1–1.0)
Neutro Abs: 4.5 10*3/uL (ref 1.4–7.7)
RBC: 3.98 Mil/uL — ABNORMAL LOW (ref 4.22–5.81)
RDW: 14.6 % (ref 11.5–14.6)

## 2010-05-25 LAB — HEPATIC FUNCTION PANEL
Albumin: 4 g/dL (ref 3.5–5.2)
Total Protein: 6.7 g/dL (ref 6.0–8.3)

## 2010-05-25 LAB — RENAL FUNCTION PANEL
Albumin: 4 g/dL (ref 3.5–5.2)
BUN: 26 mg/dL — ABNORMAL HIGH (ref 6–23)
Calcium: 8.8 mg/dL (ref 8.4–10.5)
Chloride: 104 mEq/L (ref 96–112)
Glucose, Bld: 83 mg/dL (ref 70–99)
Phosphorus: 3.1 mg/dL (ref 2.3–4.6)
Potassium: 4.5 mEq/L (ref 3.5–5.1)

## 2010-05-25 LAB — TSH: TSH: 2.59 u[IU]/mL (ref 0.35–5.50)

## 2010-06-04 NOTE — Assessment & Plan Note (Signed)
Summary: roa for 4 month f/u JRR  Nurse Visit   Vital Signs:  Patient profile:   77 year old male Weight:      168 pounds BMI:     26.02 Temp:     98.2 degrees F oral Pulse rate:   57 / minute Pulse rhythm:   regular BP sitting:   124 / 49  (left arm) Cuff size:   large  Vitals Entered By: Mervin Hack CMA Duncan Dull) (May 25, 2010 11:21 AM)  Review of Systems       appetite is good weight is up 10# sleeps okay   Past History:  Past medical, surgical, family and social histories (including risk factors) reviewed for relevance to current acute and chronic problems.  Past Medical History: Reviewed history from 07/07/2007 and no changes required. COPD Hypertension Renal insufficiency Laryngeal cancer--1/08 Hyperlipidemia Osteoarthritis Urinary incontinence  Past Surgical History: Reviewed history from 07/07/2007 and no changes required. Traumatic amputation tip right thumb 1942 T & A childhood Appendectomy 1946 Cataract removal OD 1994 Prosrate procedure (Stoioff) 10/07------------incontinence since then 1/08  Laryngeal BX shows SCC Total Larynectomy 10/08   History of Present Illness: still misses wife His children to visit regularly still in house---"I want to wait till I can get more money for it" Not ready to move to retirement home Stays home mostly No sig social interactions Has 2 cats talks to daughter every day in evening  Shops and cooks himself Gained weight now  Breathing has been okay no sig cough  Pain control is fair uses percocet only occ now---every other day or so  No chest pain No palpitations   Physical Exam  General:  alert.  NAD Neck:  no masses, no thyromegaly, and no cervical lymphadenopathy.   Same indurated areas in neck Lungs:  normal respiratory effort, no intercostal retractions, no accessory muscle use, and normal breath sounds.   Heart:  normal rate, regular rhythm, no murmur, and no gallop.   Abdomen:  soft  and non-tender.   Extremities:  no edema Psych:  normally interactive, good eye contact, not anxious appearing, and not depressed appearing.     Impression & Recommendations:  Problem # 1:  GRIEF REACTION (ICD-309.0) Assessment Improved doing okay considering moving closer to children for support doesn't want retirement home at this point  Problem # 2:  OSTEOARTHRITIS (ICD-715.90) Assessment: Unchanged okay with occ meds  His updated medication list for this problem includes:    Percocet 5-325 Mg Tabs (Oxycodone-acetaminophen) .Marland Kitchen... 1-2 three times a day as needed for severe back pain  Problem # 3:  HYPERTENSION (ICD-401.9) Assessment: Unchanged  good control no changes  will check labs  His updated medication list for this problem includes:    Ziac 5-6.25 Mg Tabs (Bisoprolol-hydrochlorothiazide) .Marland Kitchen... Take 1 tablet by mouth once a day    Lisinopril 20 Mg Tabs (Lisinopril) .Marland Kitchen... Take 1 tablet by mouth once a day  BP today: 124/49 Prior BP: 122/62 (01/19/2010)  Labs Reviewed: K+: 4.6 (09/16/2009) Creat: : 1.9 (09/16/2009)     Orders: Venipuncture (04540) TLB-Renal Function Panel (80069-RENAL) TLB-CBC Platelet - w/Differential (85025-CBCD) TLB-Hepatic/Liver Function Pnl (80076-HEPATIC) TLB-TSH (Thyroid Stimulating Hormone) (84443-TSH)  Problem # 4:  COPD (ICD-496) Assessment: Unchanged stable resp status  Complete Medication List: 1)  Percocet 5-325 Mg Tabs (Oxycodone-acetaminophen) .Marland Kitchen.. 1-2 three times a day as needed for severe back pain 2)  Ziac 5-6.25 Mg Tabs (Bisoprolol-hydrochlorothiazide) .... Take 1 tablet by mouth once a day 3)  Lisinopril  20 Mg Tabs (Lisinopril) .... Take 1 tablet by mouth once a day   Patient Instructions: 1)  Please schedule a follow-up appointment in 4 months .   CC: 4 month follow-up   Allergies: 1)  ! * Detrol  Orders Added: 1)  Est. Patient Level IV [[address]] 2)  Venipuncture [36415] 3)  TLB-Renal Function Panel  [80069-RENAL] 4)  TLB-CBC Platelet - w/Differential [85025-CBCD] 5)  TLB-Hepatic/Liver Function Pnl [80076-HEPATIC] 6)  TLB-TSH (Thyroid Stimulating Hormone) [60454-UJW] Prescriptions: PERCOCET 5-325 MG  TABS (OXYCODONE-ACETAMINOPHEN) 1-2 three times a day as needed for severe back pain  #120 x 0   Entered and Authorized by:   Cindee Salt MD   Signed by:   Cindee Salt MD on 05/25/2010   Method used:   Print then Give to Patient   RxID:   [id number]   Current Allergies (reviewed today): ! * DETROL

## 2010-09-04 NOTE — Letter (Signed)
February 17, 2006     Donalee Citrin, M.D.  [address] [place], [place] [address]   RE:  Harold Yang, Harold Yang  MRN:  [id number]  /  DOB:  [birth year]   Dear Dr. Wynetta Emery:   I am in receipt of your request for medical clearance on [name]  I  had just seen [name] on October 30.  He mentioned his continuing pain  and difficulty walking and said that neurosurgical evaluation was being  undertaken.  While I did not see him for the purpose of medical clearance  for surgery, I did have a chance to evaluate him enough to feel comfortable  to clear him.   Mr. Standing has no history of coronary disease and he reports no recent  symptoms that would be worrisome in that regard.  He does have a history of  COPD, still being a smoker and this will be his main concern in the post  operative period.  I did have the opportunity to do a spirometry in April  2007 which showed an FEV1 of 1.98 which should give him plenty of lung  reserve in the perioperative period.   His last EKG in my office was done in October and showed a sinus bradycardia  with a right bundle branch block and no evidence of acute ischemia.   In short, I see no medical contraindications to proceeding with a lumbar  laminectomy in [name].  I think the greatest attention will need to be  given to his postoperative respiratory status to prevent pneumonia and other  complications.  If there are any concerns in      this regard in the perioperative period, a consultation with our  pulmonologists would be appropriate.   Thank you for your care of our patients.      ______________________________  Karie Schwalbe, MD   RIL/MedQ  DD: 02/17/2006  DT: 02/17/2006  Job #: [id number]

## 2010-09-22 ENCOUNTER — Encounter: Payer: Self-pay | Admitting: Internal Medicine

## 2010-09-23 ENCOUNTER — Ambulatory Visit (INDEPENDENT_AMBULATORY_CARE_PROVIDER_SITE_OTHER): Payer: Medicare Other | Admitting: Internal Medicine

## 2010-09-23 ENCOUNTER — Encounter: Payer: Self-pay | Admitting: Internal Medicine

## 2010-09-23 VITALS — BP 130/60 | HR 54 | Temp 98.0°F | Ht 67.5 in | Wt 162.0 lb

## 2010-09-23 DIAGNOSIS — M1990 Unspecified osteoarthritis, unspecified site: Secondary | ICD-10-CM

## 2010-09-23 DIAGNOSIS — I10 Essential (primary) hypertension: Secondary | ICD-10-CM

## 2010-09-23 DIAGNOSIS — N400 Enlarged prostate without lower urinary tract symptoms: Secondary | ICD-10-CM | POA: Insufficient documentation

## 2010-09-23 DIAGNOSIS — J449 Chronic obstructive pulmonary disease, unspecified: Secondary | ICD-10-CM

## 2010-09-23 NOTE — Assessment & Plan Note (Signed)
Voids okay 

## 2010-09-23 NOTE — Progress Notes (Signed)
  Subjective:    Patient ID: Harold Yang, male    DOB: 1926/01/07, 77 y.o.   MRN: [id number]  HPI Doing fine Has adjusted to living alone Still grieves for wife but is moving along Hopes to sell house next spring and move closer to kids (Apex and Oswaldo Milian)  Daughter visits every 1-2 weeks Helps with some cleaning  Pain is better Rarely using the oxycodone now  Had a tooth problem Affected eating and he lost 4# but now much better Slow healing of gum  Voiding okay Stable nocturia x 2-3  No chest pain No sig edemano palpitations  Current Outpatient Prescriptions on File Prior to Visit  Medication Sig Dispense Refill  . bisoprolol-hydrochlorothiazide (ZIAC) 5-6.25 MG per tablet Take 1 tablet by mouth daily.        Marland Kitchen lisinopril (PRINIVIL,ZESTRIL) 20 MG tablet Take 20 mg by mouth daily.        Marland Kitchen oxyCODONE-acetaminophen (PERCOCET) 5-325 MG per tablet Take 1-2 tablets by mouth 3 (three) times daily as needed.         Past Medical History  Diagnosis Date  . COPD (chronic obstructive pulmonary disease)   . Hypertension   . Hyperlipidemia   . Arthritis   . Urinary incontinence   . Renal insufficiency   . Laryngeal cancer 2008    Past Surgical History  Procedure Date  . Amputation finger / thumb     Traumatic amputation tip right thumb 1942  . Tonsillectomy and adenoidectomy   . Appendectomy   . Cataract extraction 1994    OD  . Prostate surgery     Prosrate procedure (Stoioff) 10/07------------incontinence since then  . Laryngectomy 10/08    total    Family History  Problem Relation Age of Onset  . Alzheimer's disease Father   . Coronary artery disease Maternal Uncle   . Cancer Neg Hx     History   Social History  . Marital Status: Married    Spouse Name: N/A    Number of Children: 3  . Years of Education: N/A   Occupational History  . retired- Furniture conservator/restorer    Social History Main Topics  . Smoking status: Former Games developer  .  Smokeless tobacco: Not on file  . Alcohol Use: No  . Drug Use: No  . Sexually Active: Not on file   Other Topics Concern  . Not on file   Social History Narrative   Likes reading and gardening      Review of Systems No cough or breathing problems--still comfortable with not following up on chest nodule    Objective:   Physical Exam  Constitutional: He appears well-developed and well-nourished. No distress.  Neck: Normal range of motion. Neck supple. No thyromegaly present.  Cardiovascular: Normal rate, regular rhythm and normal heart sounds.  Exam reveals no gallop.   No murmur heard. Pulmonary/Chest: Effort normal. He has wheezes. He has no rales.       Decreased breath sounds and slight exp wheeze  Musculoskeletal: Normal range of motion. He exhibits no edema and no tenderness.  Lymphadenopathy:    He has no cervical adenopathy.  Psychiatric: He has a normal mood and affect. His behavior is normal. Judgment and thought content normal.          Assessment & Plan:

## 2010-09-23 NOTE — Assessment & Plan Note (Signed)
Clear PE findings but no functional changes Has done okay without treatment and doesn't smoke anymore

## 2010-09-23 NOTE — Assessment & Plan Note (Signed)
BP Readings from Last 3 Encounters:  09/23/10 130/60  05/25/10 124/49  01/19/10 122/62   Good control  No changes needed Lab Results  Component Value Date   CREATININE 1.3 05/25/2010

## 2010-09-23 NOTE — Assessment & Plan Note (Signed)
Improved Rarely needs oxycodone now

## 2010-11-07 ENCOUNTER — Other Ambulatory Visit: Payer: Self-pay | Admitting: Internal Medicine

## 2010-12-06 ENCOUNTER — Other Ambulatory Visit: Payer: Self-pay | Admitting: Internal Medicine

## 2011-03-23 ENCOUNTER — Encounter: Payer: Self-pay | Admitting: Internal Medicine

## 2011-03-23 ENCOUNTER — Ambulatory Visit (INDEPENDENT_AMBULATORY_CARE_PROVIDER_SITE_OTHER): Payer: Medicare Other | Admitting: Internal Medicine

## 2011-03-23 VITALS — BP 134/60 | HR 52 | Temp 97.5°F | Ht 67.0 in | Wt 153.0 lb

## 2011-03-23 DIAGNOSIS — J449 Chronic obstructive pulmonary disease, unspecified: Secondary | ICD-10-CM

## 2011-03-23 DIAGNOSIS — I10 Essential (primary) hypertension: Secondary | ICD-10-CM

## 2011-03-23 DIAGNOSIS — M1990 Unspecified osteoarthritis, unspecified site: Secondary | ICD-10-CM

## 2011-03-23 DIAGNOSIS — N400 Enlarged prostate without lower urinary tract symptoms: Secondary | ICD-10-CM

## 2011-03-23 DIAGNOSIS — Z23 Encounter for immunization: Secondary | ICD-10-CM

## 2011-03-23 LAB — HEPATIC FUNCTION PANEL
ALT: 15 U/L (ref 0–53)
AST: 23 U/L (ref 0–37)
Albumin: 4 g/dL (ref 3.5–5.2)
Alkaline Phosphatase: 78 U/L (ref 39–117)

## 2011-03-23 LAB — CBC WITH DIFFERENTIAL/PLATELET
Basophils Relative: 0.4 % (ref 0.0–3.0)
Eosinophils Relative: 2.4 % (ref 0.0–5.0)
HCT: 37.4 % — ABNORMAL LOW (ref 39.0–52.0)
Hemoglobin: 12.8 g/dL — ABNORMAL LOW (ref 13.0–17.0)
Lymphs Abs: 0.7 10*3/uL (ref 0.7–4.0)
MCV: 93.6 fl (ref 78.0–100.0)
Monocytes Relative: 7.3 % (ref 3.0–12.0)
Neutro Abs: 5.5 10*3/uL (ref 1.4–7.7)
Platelets: 218 10*3/uL (ref 150.0–400.0)
RBC: 3.99 Mil/uL — ABNORMAL LOW (ref 4.22–5.81)
WBC: 6.9 10*3/uL (ref 4.5–10.5)

## 2011-03-23 LAB — BASIC METABOLIC PANEL
Chloride: 103 mEq/L (ref 96–112)
GFR: 41.64 mL/min — ABNORMAL LOW (ref >60.00)
Potassium: 4.6 mEq/L (ref 3.5–5.1)
Sodium: 137 mEq/L (ref 135–145)

## 2011-03-23 LAB — TSH: TSH: 0.93 u[IU]/mL (ref 0.35–5.50)

## 2011-03-23 MED ORDER — OXYCODONE-ACETAMINOPHEN 5-325 MG PO TABS: 1.0000 | ORAL_TABLET | Freq: Three times a day (TID) | ORAL | Status: AC | PRN

## 2011-03-23 NOTE — Assessment & Plan Note (Signed)
BP Readings from Last 3 Encounters:  03/23/11 134/60  09/23/10 130/60  05/25/10 124/49   Good control No changes needed Due for labs

## 2011-03-23 NOTE — Assessment & Plan Note (Signed)
Uses the percocet mostly at bedtime Handicapped permit signed

## 2011-03-23 NOTE — Assessment & Plan Note (Signed)
No sig voiding problems

## 2011-03-23 NOTE — Assessment & Plan Note (Signed)
No functional issues No Rx indicated

## 2011-03-23 NOTE — Progress Notes (Signed)
  Subjective:    Patient ID: Harold Yang, male    DOB: [birth year], 77 y.o.   MRN: [id number]  HPI Doing fine  Has some neck pain that can really bother him at night The percocet helps him sleep Still gets around with walker Larey Seat a couple of days ago while reaching into refrigerator---legs went out. No injury Drives still just as needed  Daughter still comes every other week to help with cleaning,etc Son here for Thanksgiving also No depressed mood, Not anhedonic Lives with cat  No chest pain No SOB No cough No palpitations  Voids okay Nocturia stable x 2 No daytime problems  Current Outpatient Prescriptions on File Prior to Visit  Medication Sig Dispense Refill  . bisoprolol-hydrochlorothiazide (ZIAC) 5-6.25 MG per tablet Take 1 tablet by mouth daily.        Marland Kitchen lisinopril (PRINIVIL,ZESTRIL) 20 MG tablet TAKE 1 TABLET BY MOUTH ONCE A DAY  30 tablet  3    Allergies  Allergen Reactions  . Tolterodine Tartrate     REACTION: urinary retention    Past Medical History  Diagnosis Date  . COPD (chronic obstructive pulmonary disease)   . Hypertension   . Hyperlipidemia   . Arthritis   . Urinary incontinence   . Renal insufficiency   . Laryngeal cancer 2008    Past Surgical History  Procedure Date  . Amputation finger / thumb     Traumatic amputation tip right thumb 1942  . Tonsillectomy and adenoidectomy   . Appendectomy   . Cataract extraction 1994    OD  . Prostate surgery     Prosrate procedure (Stoioff) 10/07------------incontinence since then  . Laryngectomy 10/08    total    Family History  Problem Relation Age of Onset  . Alzheimer's disease Father   . Coronary artery disease Maternal Uncle   . Cancer Neg Hx     History   Social History  . Marital Status: Married    Spouse Name: N/A    Number of Children: 3  . Years of Education: N/A   Occupational History  . retired- Furniture conservator/restorer    Social History Main Topics  .  Smoking status: Former Games developer  . Smokeless tobacco: Never Used  . Alcohol Use: No  . Drug Use: No  . Sexually Active: Not on file   Other Topics Concern  . Not on file   Social History Narrative   Likes reading and gardening   Review of Systems Appetite is not great Has lost some weight Was having some blood from the trach site---better with humidifier Satisfied with sleep    Objective:   Physical Exam  Constitutional: He appears well-developed and well-nourished. No distress.  Neck: No thyromegaly present.       Uses voice modulator   Cardiovascular: Normal rate, regular rhythm and normal heart sounds.  Exam reveals no gallop.   No murmur heard. Pulmonary/Chest: Effort normal. No respiratory distress. He has no rales.       Decreased breath sounds with mild exp phase prolongation  Abdominal: Soft. There is no tenderness.  Musculoskeletal: He exhibits no edema and no tenderness.  Lymphadenopathy:    He has no cervical adenopathy.  Psychiatric: He has a normal mood and affect. His behavior is normal. Judgment and thought content normal.          Assessment & Plan:

## 2011-03-24 ENCOUNTER — Ambulatory Visit: Payer: Medicare Other | Admitting: Internal Medicine

## 2011-04-20 ENCOUNTER — Ambulatory Visit: Payer: Self-pay | Admitting: Internal Medicine

## 2011-04-26 ENCOUNTER — Other Ambulatory Visit: Payer: Self-pay | Admitting: Internal Medicine

## 2011-05-19 LAB — CBC
HCT: 32 % — ABNORMAL LOW (ref 40.0–52.0)
HGB: 11 g/dL — ABNORMAL LOW (ref 13.0–18.0)
MCH: 32 pg (ref 26.0–34.0)
MCHC: 34.2 g/dL (ref 32.0–36.0)
MCV: 93 fL (ref 80–100)

## 2011-05-19 LAB — BASIC METABOLIC PANEL
Anion Gap: 14 (ref 7–16)
Calcium, Total: 8.8 mg/dL (ref 8.5–10.1)
Chloride: 99 mmol/L (ref 98–107)
Osmolality: 285 (ref 275–301)
Potassium: 4.2 mmol/L (ref 3.5–5.1)
Sodium: 134 mmol/L — ABNORMAL LOW (ref 136–145)

## 2011-05-20 ENCOUNTER — Inpatient Hospital Stay: Payer: Self-pay | Admitting: Internal Medicine

## 2011-05-20 LAB — URINALYSIS, COMPLETE
Glucose,UR: NEGATIVE mg/dL (ref 0–75)
Nitrite: POSITIVE
Specific Gravity: 1.012 (ref 1.003–1.030)
WBC UR: 116 /HPF (ref 0–5)

## 2011-05-20 LAB — HEPATIC FUNCTION PANEL A (ARMC)
Bilirubin, Direct: 0.1 mg/dL (ref 0.00–0.20)
Bilirubin,Total: 0.5 mg/dL (ref 0.2–1.0)
Total Protein: 7.6 g/dL (ref 6.4–8.2)

## 2011-05-21 ENCOUNTER — Ambulatory Visit: Payer: Self-pay | Admitting: Internal Medicine

## 2011-05-21 LAB — CBC WITH DIFFERENTIAL/PLATELET
Basophil %: 0 %
HCT: 33.6 % — ABNORMAL LOW (ref 40.0–52.0)
HGB: 11.3 g/dL — ABNORMAL LOW (ref 13.0–18.0)
Lymphocyte %: 6.2 %
Monocyte %: 12.3 %
Neutrophil #: 4.2 10*3/uL (ref 1.4–6.5)
WBC: 5.2 10*3/uL (ref 3.8–10.6)

## 2011-05-21 LAB — BASIC METABOLIC PANEL
Anion Gap: 12 (ref 7–16)
BUN: 35 mg/dL — ABNORMAL HIGH (ref 7–18)
Calcium, Total: 8.2 mg/dL — ABNORMAL LOW (ref 8.5–10.1)
Chloride: 105 mmol/L (ref 98–107)
Glucose: 87 mg/dL (ref 65–99)
Osmolality: 285 (ref 275–301)

## 2011-05-23 LAB — URINE CULTURE

## 2011-06-02 ENCOUNTER — Ambulatory Visit: Payer: Medicare Other | Admitting: Internal Medicine

## 2011-06-18 ENCOUNTER — Ambulatory Visit: Payer: Self-pay | Admitting: Internal Medicine

## 2011-09-24 ENCOUNTER — Ambulatory Visit: Payer: Medicare Other | Admitting: Internal Medicine

## 2011-09-24 DIAGNOSIS — Z0289 Encounter for other administrative examinations: Secondary | ICD-10-CM

## 2011-11-18 DEATH — deceased

## 2012-07-21 IMAGING — CR DG CHEST 1V PORT
1 series · 2 of 2 positions shown · non-contrast
Comparison: none

REASON FOR EXAM: fever, weakness
COMMENTS:

[Series 1: portable · 0.17mm/px · 2 of 2 slices shown]
[im 1/2]
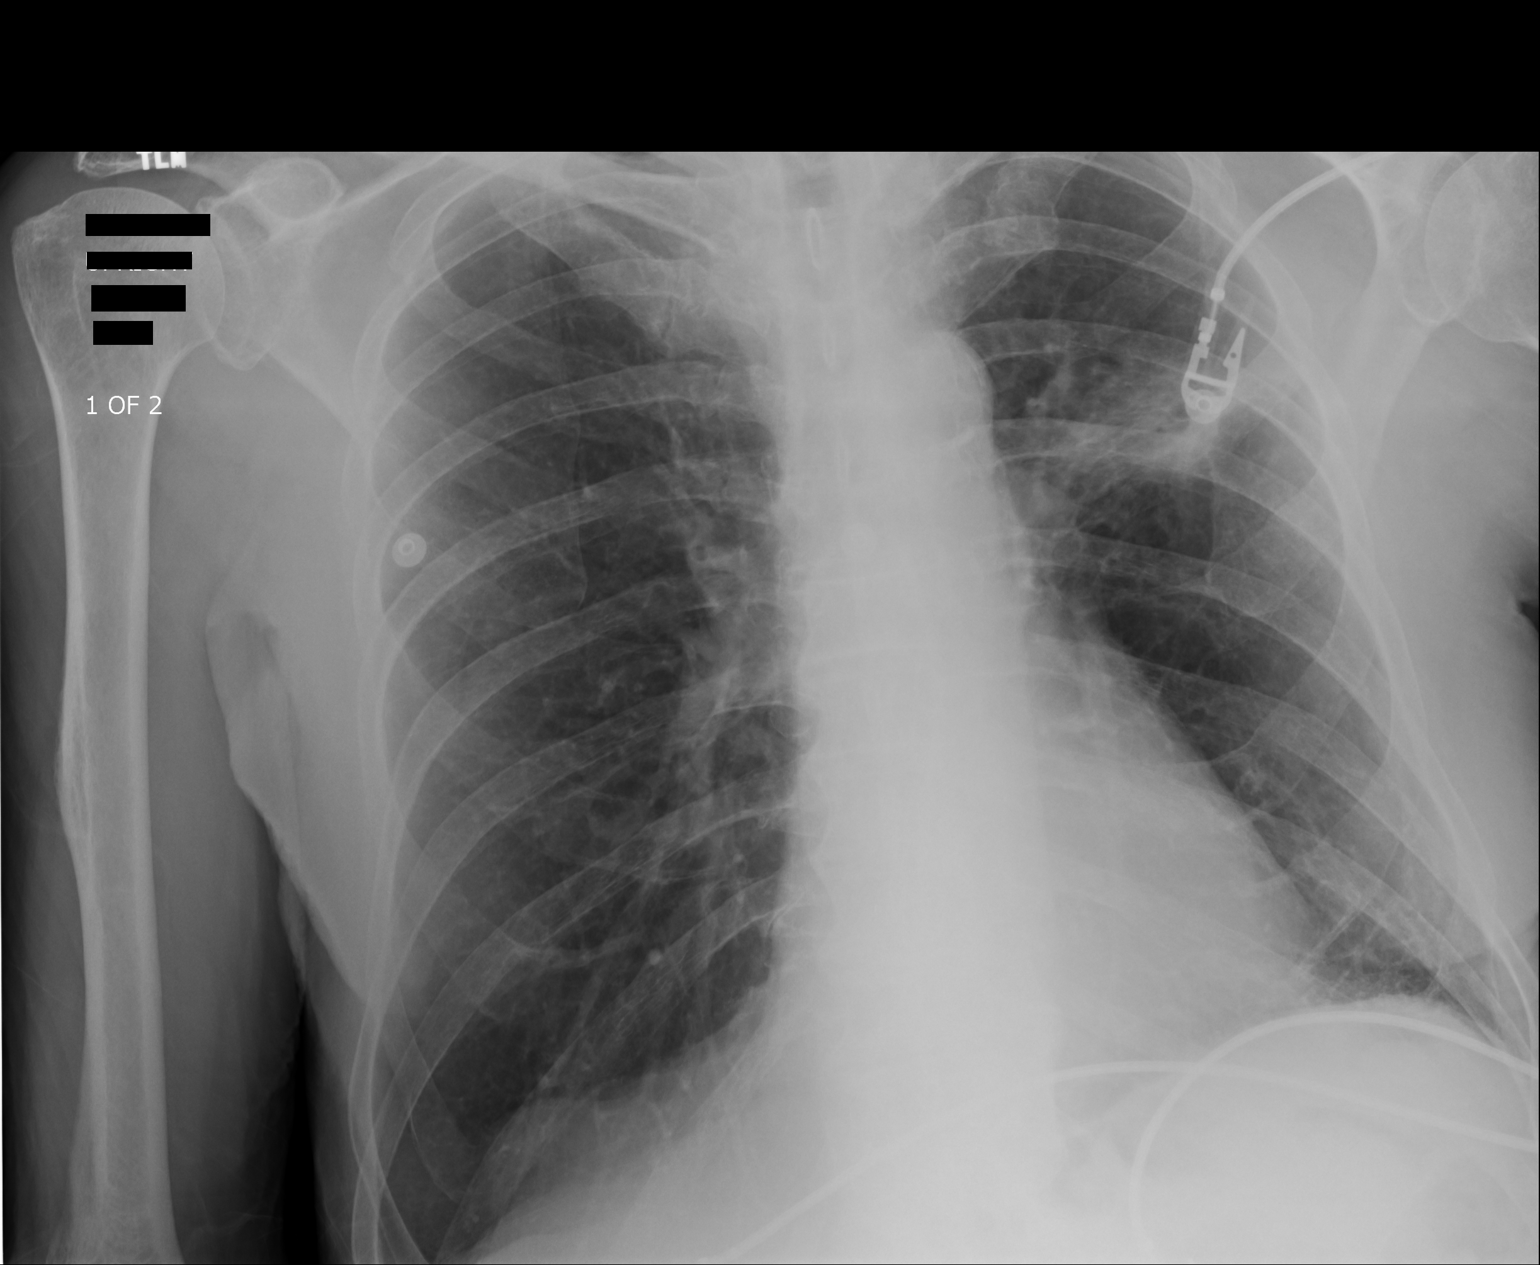
[im 2/2]
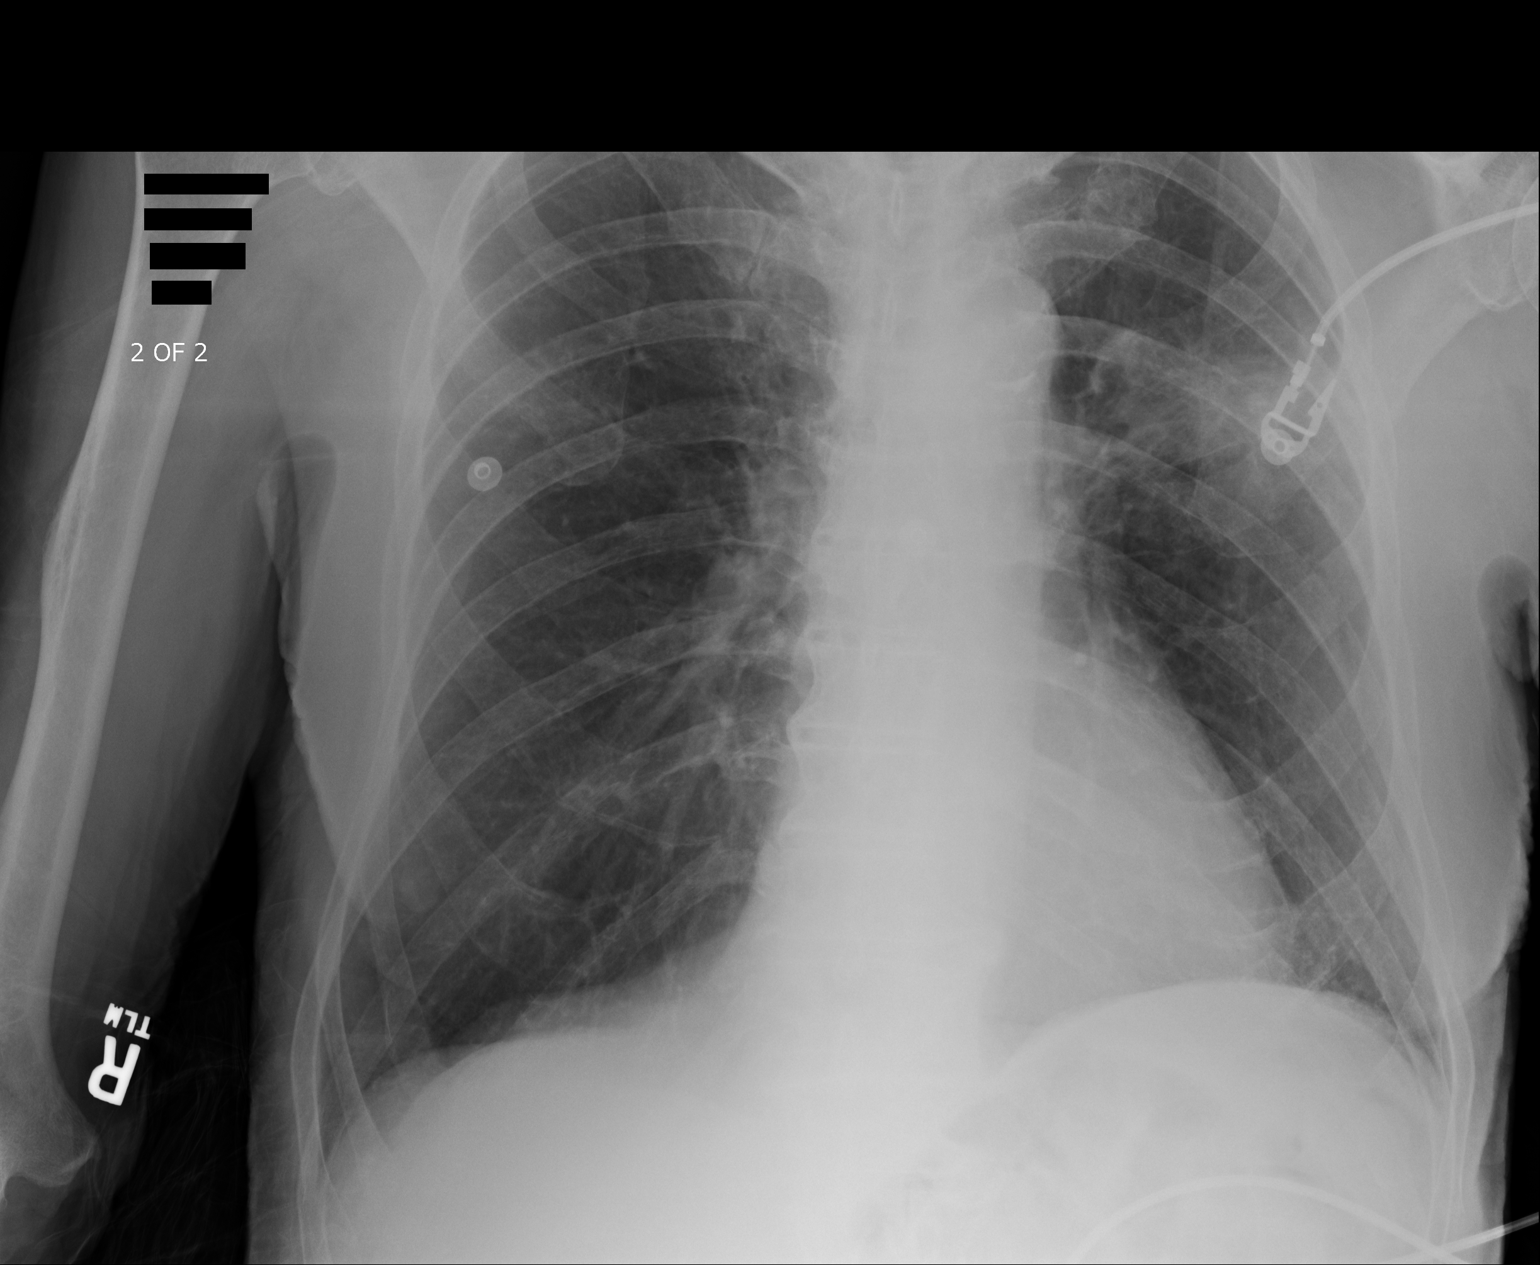

[2 of 2 positions shown; findings below may reference images not displayed]

PROCEDURE:     DXR - DXR PORTABLE CHEST SINGLE VIEW  - May 19, 2011 [DATE]

RESULT:     There is no previous exam for comparison.

Cardiac monitoring electrodes present. There is increased density in the
left upper lobe radiating to the hilum concerning for left-sided pneumonia.
Underlying malignant disease is not excluded and followup to document
complete clearing is recommended. Please see the separately dictated chest
CT report. The lungs are otherwise clear. The cardiac silhouette is normal.
IMPRESSION: 1. Abnormal area in the left lung just above the level of the hilum
concerning for underlying malignant disease versus pneumonia.

## 2012-07-22 IMAGING — CT CT CHEST W/O CM
1 series · 15 of 33 positions shown, 19 images · non-contrast
Comparison: none

REASON FOR EXAM: left lung area of consolidation
COMMENTS:

PROCEDURE:     CT  - CT CHEST WITHOUT CONTRAST  - May 20, 2011 [DATE]
RESULT:     Comparison: Chest radiograph performed same day. PET CT
12/09/2006. CT of the chest 04/26/2006
TECHNIQUE: Multiple axial images of the chest were obtained without
intravenous contrast.

[Series 2: soft tissue · axial · 0.66mm/px · z∈[-846,-542]mm · 15 of 73 slices shown, 19 images]
[im 6/73  mediastinal]
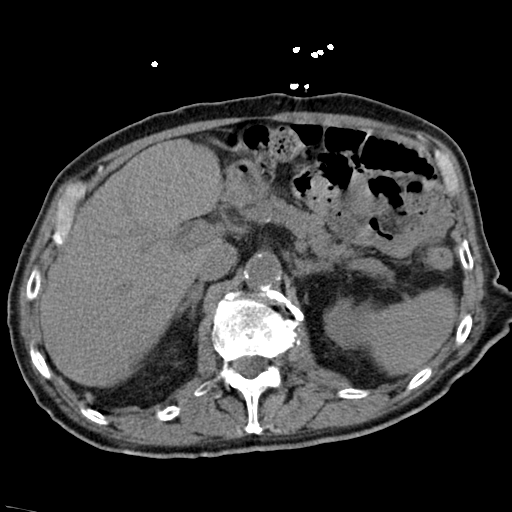
[im 6/73  lung]
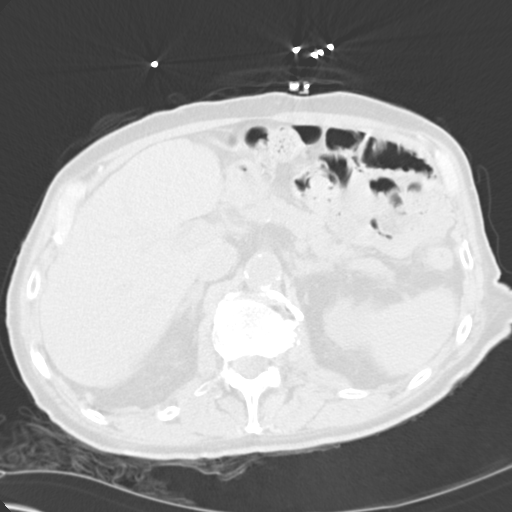
[im 11/73  lung]
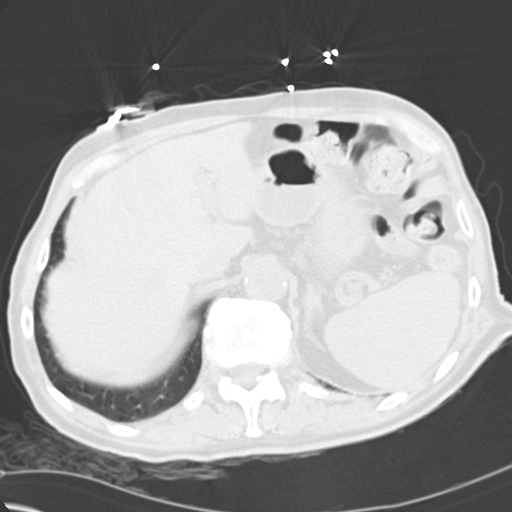
[im 15/73  lung]
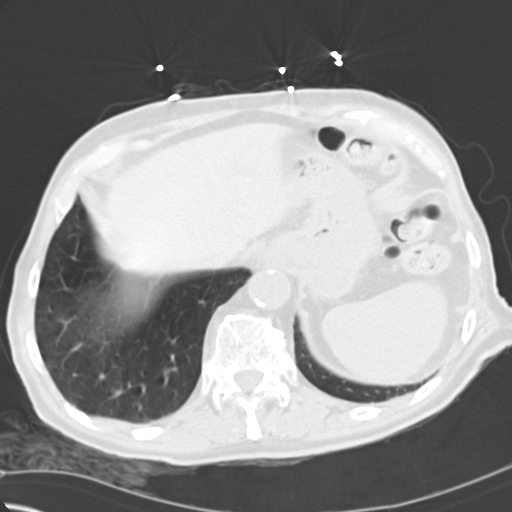
[im 19/73  lung]
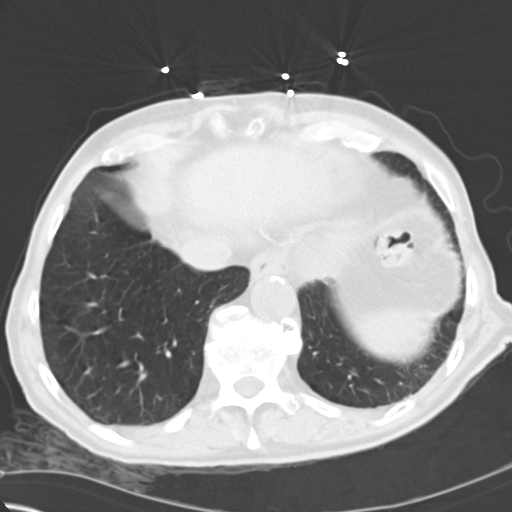
[im 25/73  mediastinal]
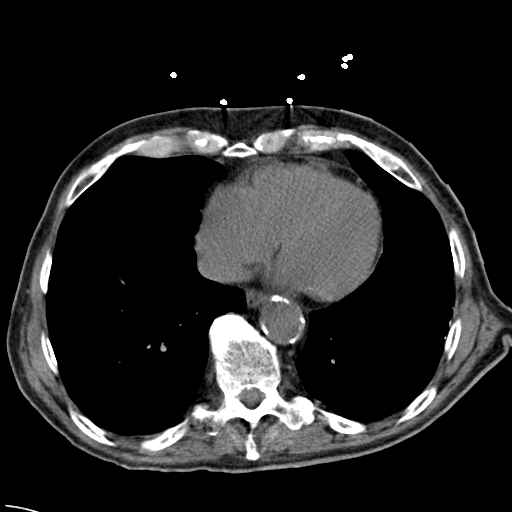
[im 25/73  lung]
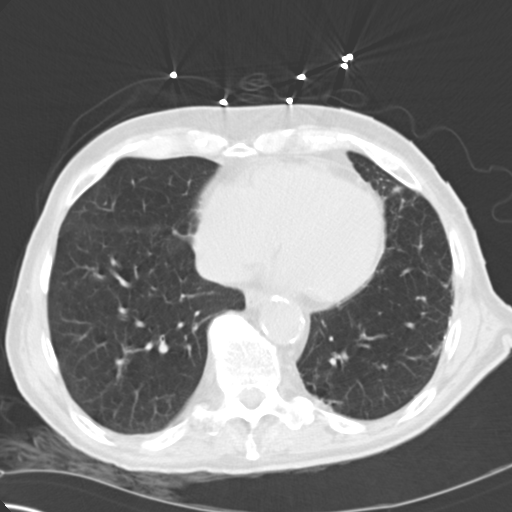
[im 29/73  lung]
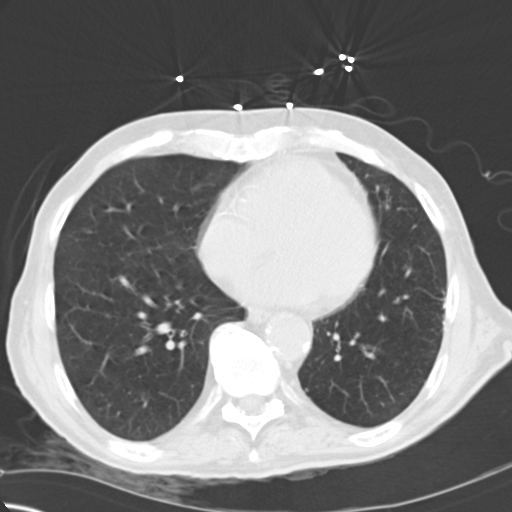
[im 33/73  lung]
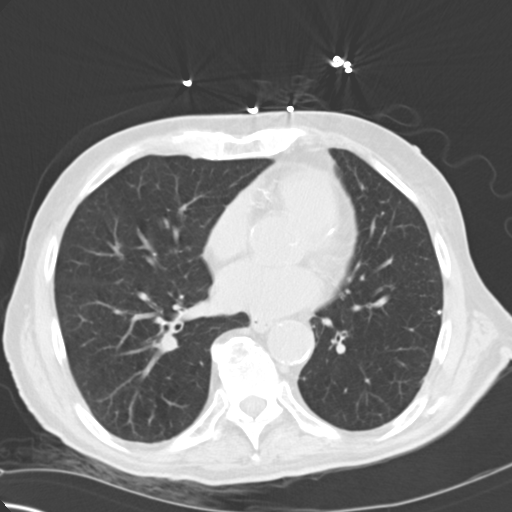
[im 38/73  lung]
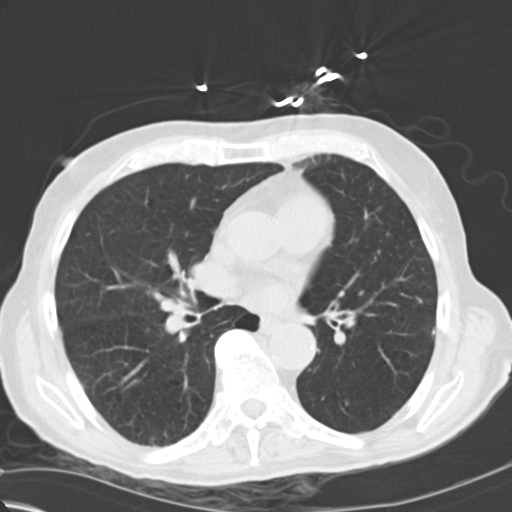
[im 41/73  mediastinal]
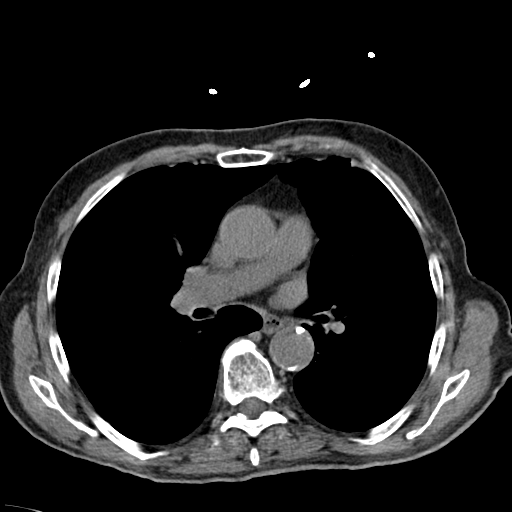
[im 41/73  lung]
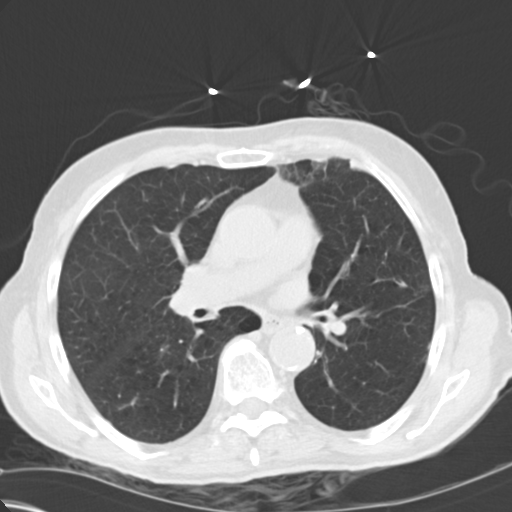
[im 44/73  lung]
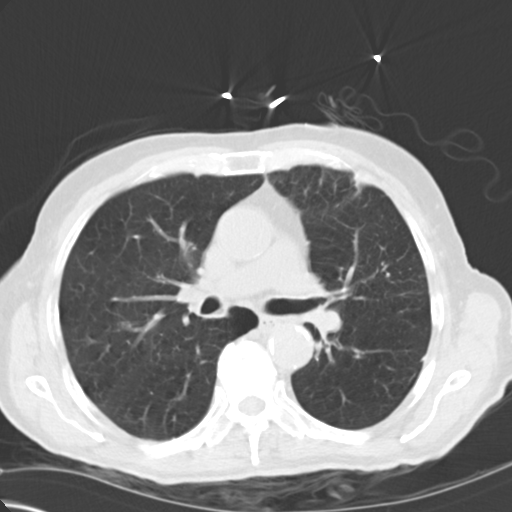
[im 49/73  lung]
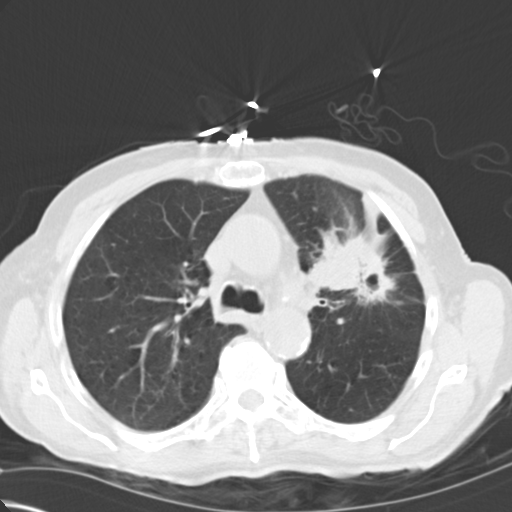
[im 54/73  lung]
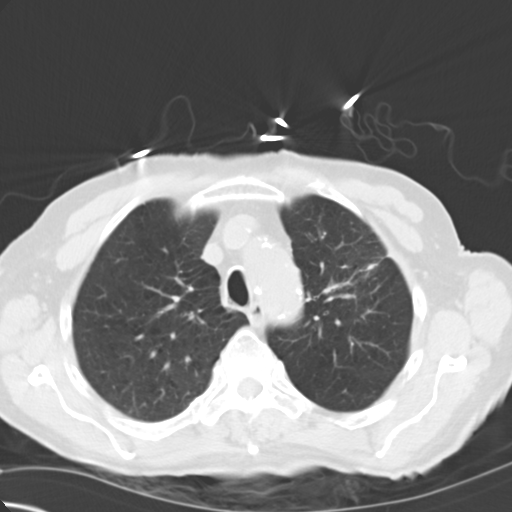
[im 58/73  mediastinal]
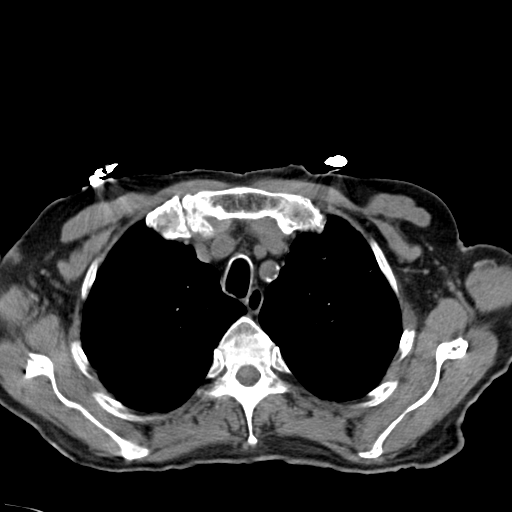
[im 58/73  lung]
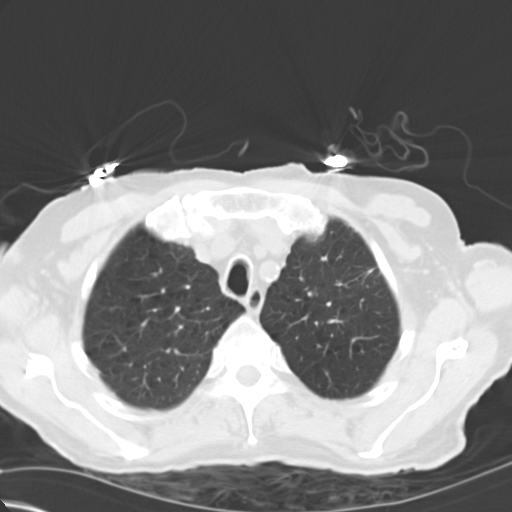
[im 62/73  lung]
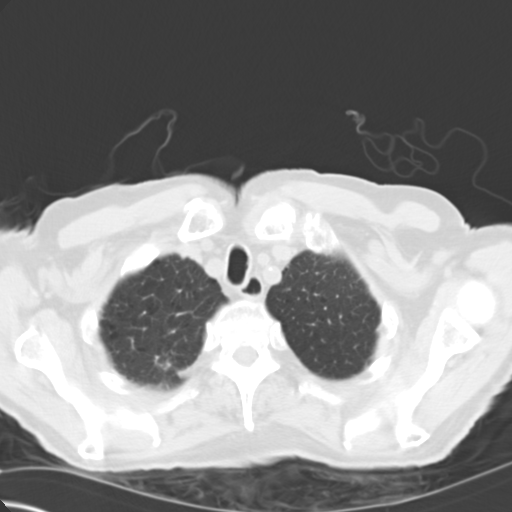
[im 67/73  lung]
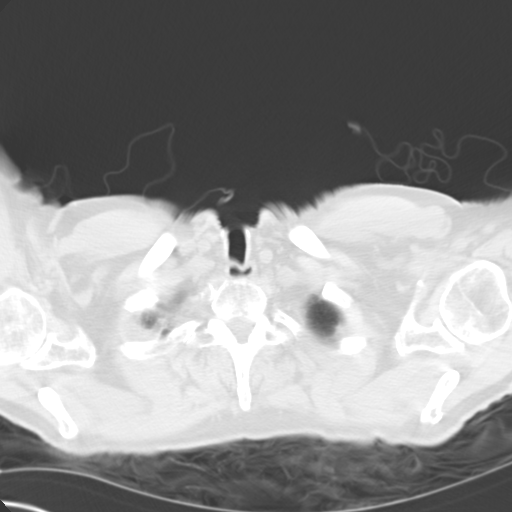

[15 of 33 positions shown; findings below may reference images not displayed]

FINDINGS: No mediastinal, hilar, or axillary lymphadenopathy. Postsurgical changes
seen from tracheostomy. Calcifications are seen in the coronary arteries.
Tiny low-attenuation lesion in the hepatic dome is too small to
characterize, but appears similar to prior. There is mild thickening of the
adrenal glands, without definite discrete mass. Small focus of air in the
region of the retroperitoneum on the last image is likely air within the
third portion of the duodenum, incompletely visualized.

There is mild centrilobular emphysema. There is a 9 mm nodular density in
the right middle lobe, which is new from prior. There is a 4 mm nodule at
the right costophrenic angle, similar to prior. 3 mm nodule is seen in the
right upper lobe, image 28. There is a 3 mm subpleural nodule in the right
upper lobe, image 19. There is a spiculated mass in the left upper lobe
which measures 5.4 x 3.4 cm. There is some central cavitation. The mass
extends from the left pleura to the mediastinum, adjacent to the left main
pulmonary artery. This is concerning for malignancy.

No aggressive lytic or sclerotic osseous lesions are identified.
IMPRESSION: 1. Spiculated mass, with some areas of cavitation, in the left upper lobe
concerning for malignancy. Infection is not excluded. Oncology consultation
is suggested.
2. Indeterminate pulmonary nodules, the largest of which measures 9 mm in
the right middle lobe.

## 2014-08-11 NOTE — H&P (Signed)
PATIENT NAME:  Harold Yang, Harold Yang MR#:  [id number] DATE OF BIRTH:  09-23-1925  DATE OF ADMISSION:  05/20/2011  REFERRING PHYSICIAN: Dr. Marjean Donna PRIMARY CARE PHYSICIAN: Dr. 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 Patient states that his neighbor's daughter comes by to check on him. Apparently the neighbor's daughter could not get into patient's house therefore she called the patient's son who called EMS and thereafter patient was brought to the ER. Patient stated he was doing okay until about a week ago when he started to feel generally weak and he has had difficulty walking. He also reports anorexia and poor p.o. intake as well as approximate 8 pounds weight loss over the past month. He reports a cough with mucus production which has been going on since his laryngectomy and this is not new and has not recently worsened. Denies any shortness of breath. Denied any hemoptysis. He does report some subjective fever over the past few weeks. He also had some chills. Otherwise he is without specific complaints at this time. Patient underwent a CT of the chest without contrast in the ER and noted to have left upper lobe spiculated mass with central cavitation concerning for malignancy and this is 5 x 3 cm. He also has some pulmonary nodules some of which are new, largest in the right middle lobe at 9 mm. Patient states that he had some "spot on the lungs" that were being monitored by physicians at [facility]. He underwent laryngectomy in December 2008. He is no longer smoking. Otherwise, patient is without specific complaints at this time. He is also noted to have acute renal failure with BUN 59,  creatinine 2.07 and urinalysis is indicative of a urinary tract infection. Patient is currently afebrile and without leukocytosis. Given all these findings in addition to patient's symptoms hospitalist services were contacted for further evaluation and for hospital admission.   PAST SURGICAL HISTORY:  1. Vocal cord biopsy.  2. Total laryngectomy December 2008 at [facility].  3. Appendectomy.  4. Penile biopsy with CO2 laser ablation and penile erythema.  5. Photoselective vaporization of the prostate.   PAST MEDICAL HISTORY:  1. Hypertension.  2. Former tobacco abuse.  3. Prolonged alcohol use. Patient states he consumed alcohol mostly beer and wine for approximately 55 years on and off.  4. Benign prostatic hypertrophy with history of urinary retention.  5. 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   ALLERGIES: No known drug allergies.   HOME MEDICATIONS: 1. Patient takes a blood pressure pill in the morning, not sure of the name but this is not lisinopril. He takes lisinopril at night.  2. Percocet/acetaminophen in the morning and night.   FAMILY HISTORY: Negative for malignancy in the parents. Patient says he has an aunt with unknown type of malignancy.   SOCIAL HISTORY: Tobacco-No longer using. Patient quit in December 2008 after his laryngectomy. In the past he smoked 1.5 packs per day for at least 50 years. Alcohol-Consuming alcohol on and off for 55 years, mostly beer and wine. No longer consuming alcohol. Illicit drugs-None. Patient lives at home by himself in the  [place], [place]. His wife died in 2009-08-11. He has three children, a son named Zenia Resides who lives in [place], [phone] and daughter, Rae Halsted, who lives in [place].   REVIEW OF SYSTEMS: CONSTITUTIONAL: Reports generalized weakness, subjective fever and chills and fatigue. Denies any pain. Also  reports weight loss. HEAD/EYES: Denies headache or blurry vision. ENT: Denies tinnitus, earache, nasal discharge, or sore throat. RESPIRATORY: Has chronic cough. Denies wheezing or hemoptysis. Denies shortness of breath. CARDIOVASCULAR: Denies chest pain or heart palpitations, lower extremity edema. GASTROINTESTINAL: Denies nausea, vomiting, diarrhea, constipation, melena, hematochezia, or abdominal pains. GENITOURINARY: Denies dysuria or hematuria. ENDOCRINE: Denies heat or cold intolerance. HEME/LYMPH: Denies easy bruising or bleeding. INTEGUMENTARY: Denies rash. MUSCULOSKELETAL: Has generalized muscle weakness. Denies any joint or back pain currently. NEUROLOGICAL: Has generalized weakness. Denies numbness, tingling, dysarthria. PSYCHIATRIC: Denies depression or anxiety.   PHYSICAL EXAMINATION:  VITAL SIGNS: Temperature 97.5, pulse 57, blood pressure 123/59, respirations 18, oxygen saturation 99% on room air.   GENERAL: Elderly appearing, cachectic male who is also chronically ill-appearing, alert and oriented, not acutely distressed.   HEENT: Normocephalic, atraumatic. Pupils are equal, round, and reactive to light and accommodation. Extraocular movements are intact. Anicteric sclerae. Conjunctivae pink. Hearing intact to voice. Nares without drainage. Oral mucous is dry but without any lesions noted.   NECK: Stoma noted over cricopharyngeal region which appears to be clean and dry. No drainage noted or surrounding erythema. This is nontender to palpation. Otherwise, there are no palpable masses in the neck. No lymphadenopathy or carotid bruits.   CHEST: Normal respiratory effort without use of accessory respiratory muscles. Patient has decreased breath sounds bilaterally with hyperresonance to percussion. No crackles or rales, wheezing, or rhonchi.   CARDIOVASCULAR: Distant S1, S2. Regular rate, rhythm. No murmurs, rubs, or gallops. PMI is non-lateralized.   ABDOMEN: Soft, nontender,  nondistended. Normoactive bowel sounds. No hepatosplenomegaly or palpable masses. No hernias.   EXTREMITIES: Patient has some digital clubbing. No cyanosis or edema. Pedal pulses are palpable bilaterally.   SKIN: No suspicious rashes. Skin turgor is poor.   LYMPH: No cervical lymphadenopathy. No supraclavicular masses are noted.   NEUROLOGIC: Alert and oriented x3. Cranial nerves II through XII are grossly intact. He is generally weak and this is symmetric. Strength is 4/5 in upper and lower extremities bilaterally. Sensation intact to light touch throughout. Patellar deep tendon reflexes are 2+. Finger to nose testing intact bilaterally. Otherwise, no focal deficits.   PSYCH: Pleasant male with appropriate affect.   LABORATORY, DIAGNOSTIC AND RADIOLOGICAL DATA:  CBC: WBC 9.3, hemoglobin 11, hematocrit 32, platelets 168.   BMP: Sodium 134, potassium 4.2, chloride 99, bicarbonate 21, BUN 59, creatinine 2.07, glucose 95, calcium 8.8, anion gap 14.   Urinalysis: Cloudy urine with nitrite positive, 3+ leukocyte esterase, 2+ blood, 7 RBCs, 116 WBCs, 3+ bacteria, WBC clumping is present.   Portable chest x-ray: Abnormal area in the left lung just above the level of the hilum concerning for underlying malignant disease versus pneumonia.   CT of the chest without contrast 05/20/2011: Mild central lobular emphysema, 9 mm nodular density in the right middle lobe which is new from prior, 4 mm nodule right costophrenic angle similar to prior 3 mm nodule in the right upper lobe, 3 mm subpleural nodule in the right upper lobe, spiculated mass in the left upper lobe which measures 5.4 x 3.4 cm with central cavitation, mass extends from the left pleura to the mediastinum adjacent to the left main pulmonary artery  which is concerning for malignancy. No aggressive lytic or sclerotic osseous lesions are identified. There is no mediastinal hilar or axillary lymphadenopathy. Postsurgical changes are seen from  tracheostomy.   EKG normal sinus rhythm 72 beats per minute with left axis deviation, right bundle branch block. No prior EKGs for comparison in our system.   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 intake and weight loss over the past month here with:  1. Left upper lobe lung mass and new pulmonary nodules-Concerning for malignancy. Not sure if this represents recurrent laryngeal cancer versus a new primary lung cancer. Recommend hospital admission for further evaluation and management. Will admit patient to oncology unit. Will consider a PET scan. Patient is still deciding on how aggressive he wants this to be managed. He wants to await further input from pulmonology and oncology so will obtain pulmonology and oncology consultations for now and also obtain palliative care evaluation to help define goals of care.  2. Generalized weakness-Suspect this is multifactorial due to cancer plus dehydration with acute renal failure and urinary tract infection. Work-up for malignancy is in progress as above with pulmonology and oncology evaluation and patient is still undecided on aggressiveness of care. Will manage urinary tract infection with antibiotics. Manage dehydration with IV fluids. Will see if this leads to improvement of his weakness. Will also obtain PT evaluation.  3. Acute renal failure-Will hold lisinopril and avoid nephrotoxins. Will obtain renal ultrasound especially given history of benign prostatic hypertrophy and urinary retention. Suspect he has a prerenal component as well with volume depletion given poor p.o. intake. Will hydrate patient with IV fluids, monitor ins and  outs and reassess renal function in a.m. posthydration.  4. Urinary tract infection-Based off abnormal urinalysis. Will obtain urine culture and start IV antibiotics in the form of Rocephin. He is currently afebrile and without leukocytosis.  5. Hypertension-Need to verify patient's home blood pressure medications. Will hold lisinopril given acute renal failure. Blood pressure is currently well controlled. Will keep patient on p.r.n. IV hydralazine for now and monitor blood pressure closely. 6. Former tobacco abuse and prolonged alcohol use-Patient no longer smoking and no longer consuming alcohol.  7. History of benign prostatic hypertrophy and urinary retention-Not sure if patient is on any agents for benign prostatic hypertrophy. Need to verify his home medications list.  8. DVT prophylaxis. Subcutaneous heparin.  9. CODE STATUS: DO NOT RESUSCITATE. Patient tells me that he has a LIVING WILL and he also verbalized to me that in the event of cardiopulmonary arrest he does not wish to receive any cardiopulmonary resuscitation or cardiac defibrillation, intubation or mechanical ventilation and states "when my time comes just let me go and I don't want any extraordinary measures" therefore to respect patient's wishes and as per his desires will keep his CODE STATUS as DO NOT RESUSCITATE per his request.    TIME SPENT ON THIS ADMISSION: Approximately 70 minutes.   ____________________________ Romie Jumper, MD knl:cms D: 05/20/2011 08:32:51 ET T: 05/20/2011 09:16:43 ET JOB#: [id number]  cc: Romie Jumper, MD, <Dictator> Venia Carbon, MD Romie Jumper MD ELECTRONICALLY SIGNED 06/06/2011 3:25

## 2014-08-11 NOTE — Discharge Summary (Signed)
PATIENT NAME:  Harold Yang, Harold Yang MR#:  [id number] DATE OF BIRTH:  1926/02/18  DATE OF ADMISSION:  05/20/2011 DATE OF DISCHARGE:  05/24/2011  ADMITTING PHYSICIAN: Dr. Dagoberto Ligas   DISCHARGING PHYSICIAN: Dr. Gladstone Lighter PRIMARY CARE PHYSICIAN: Dr. Silvio Pate   CONSULTATIONS IN THE HOSPITAL:  1. Palliative care consultation by Dr. Izora Gala Phifer.  2. Pulmonary critical care consultation by Dr. Wallene Huh.  3. Oncology consultation by Dr. Barbette Reichmann.   DISCHARGE DIAGNOSES:  1. Left upper lobe lung mass with necrotic features concerning for malignancy either primary or metastatic, but the patient refused further work-up or treatment and opted for Hospice.  2. History of squamous cell laryngeal carcinoma, status post total laryngectomy as he failed radiation therapy. He speaks with an electronic speaking device.  3. Generalized weakness.  4. Hypertension.  5. Failure to thrive.  6. Acute renal failure on admission, which has resolved now.  7. Prior tobacco use disorder.  8. Benign prostatic hypertrophy.  9. Prior alcohol abuse.  10. E. coli urinary tract infection.  DISCHARGE MEDICATIONS:  1. Levaquin 500 mg p.o. daily until 05/29/2011.  2. Artificial Tears, one drop each eye b.i.d.  3. Norvasc 10 mg p.o. daily.  4. Advair 250/50, 1 puff b.i.d.  5. Combivent inhaler 3 puffs q. 6 hours p.r.n.  6. Roxanol 20 mg per mL, 0.25 to 0.5 mL p.o. or sublingual q. 4 hours p.r.n. for pain or dyspnea.   DISCHARGE DIET: Low-sodium diet.   DISCHARGE ACTIVITY: As tolerated.   HOME OXYGEN: None.     FOLLOWUP INSTRUCTIONS:  1. Primary care physician followup in 1 to 2 weeks.  2. Physical therapy. 3. Pulmonary followup in two weeks.   LABS AND IMAGING STUDIES:  WBC 5.2, hemoglobin 11.3, hematocrit 33.6, platelets 168.   Sodium 139, potassium 4.2, chloride 105, bicarbonate 22, BUN 35, creatinine 1.2, glucose of 87, calcium 8.2.   ALT 21, AST 36, alkaline phosphatase 62, total  bilirubin 0.5, albumin 3.3, magnesium 1.9. TSH 1.3.   Urinalysis showing E. coli urinary tract infection sensitive to most of the antibiotics.   Ultrasound kidneys bilaterally showing small right renal cyst but no obstruction, solid masses, or definite stone identified.    Urinalysis: Nitrite and leukocyte esterase positive with several WBCs and 3+ bacteria.   Chest x-ray showing abnormal area in the left lung just above the hilum concerning for malignant disease versus pneumonia.   Creatinine at the time of admission was 2.07 with BUN of 59.   CT of the chest with contrast showing spiculated mass with areas of cavitation in the left upper lobe consistent with malignancy. Indeterminate pulmonary nodules, largest of which measures 9 mm present in the right middle lobe.   BRIEF HOSPITAL COURSE: Harold Yang is an 81 year old male with a past medical history significant for history of squamous cell laryngeal carcinoma, status post total laryngectomy, history of smoking and alcohol use in the past, and history of hypertension who presented to the hospital secondary to decreased p.o. intake and also failure to thrive. He has been having a cough with more mucus production and became generally weak such that he was not even able to open his door.  In the ER he was found to be in acute renal failure with BUN 59, creatinine 2.07 and CT chest showing left upper lobe lung mass with cavitary lesion.  1. Left upper lobe lung mass with pulmonary nodules likely concerning for malignancy, either primary lung cancer versus metastatic from his recurrent laryngeal  carcinoma. The plan was to get a PET scan and also oncology help.  He was seen by Dr. Inez Pilgrim from the oncology standpoint. However, the patient said that he was followed at [facility] for spots in his lung and advised to get biopsy and possible radiation therapy, but he withdrew from the [facility] clinic and declined to have further followup. After extensive  discussion with the patient, he does not want to pursue any further diagnosis or treatment. He said that when it is time for him to go he would go and is "not afraid of death". He was also seen by palliative care and has considered Hospice care. Since the patient is not yet Hospice appropriate, he would be going to short-term rehab for strengthening and possible Hospice after that.  The patient will be sent on inhalers, Advair and Combivent, for improving oxygenation. He will also be sent on Roxanol for pain or dyspnea.  2. Hypertension. Norvasc 10 mg.  3. Escherichia coli urinary tract infection. On Rocephin while in the hospital, being changed to [place] at the time of discharge. 4. Acute renal failure on admission, likely prerenal secondary to inadequate p.o. intake. Improved with IV fluids in the hospital and creatinine improved at the time of discharge. Overall course has been otherwise uneventful in the hospital.   DISCHARGE CONDITION: Guarded with a poor prognosis.   DISCHARGE DISPOSITION: Short-term rehabilitation.  CODE STATUS: DO NOT RESUSCITATE.   TIME SPENT ON DISCHARGE: 45 minutes.   ____________________________ Gladstone Lighter, MD rk:bjt D:  05/24/2011 13:54:21 ET          T: 05/24/2011 14:30:58 ET          JOB#: [id number]  cc: Herbon E. Raul Del, MD Simonne Come Inez Pilgrim, MD Venia Carbon, MD  Gladstone Lighter MD ELECTRONICALLY SIGNED 05/25/2011 13:39

## 2014-08-11 NOTE — Consult Note (Signed)
PATIENT NAME:  Harold Yang, Harold Yang MR#:  [id number] DATE OF BIRTH:  [birth year]  DATE OF CONSULTATION:  05/20/2011  REFERRING PHYSICIAN:   CONSULTING PHYSICIAN:  Simonne Come. Paulo Keimig, MD  HISTORY OF PRESENT ILLNESS: The patient is an 81 year old man who was seen and evaluated today, he was admitted also today. He has a history of squamous cancer of the larynx. He had radiation therapy and then failed treatment and had a total laryngectomy in the past. He had been followed at [facility]. He was admitted with failure to thrive, difficulty walking, decreased p.o. intake, cough, and azotemia. He was hospitalized and given IV fluids for support. He had an abnormal chest x-ray and underwent a CT without contrast in the ER and he has a left upper lobe spiculated mass, also some other pulmonary nodules, the largest one is 9 mm in the right lung which appears new compared to prior reports. His laryngectomy was in December 2008. It was unknown to his family until today but it is revealed in reviewing his history that in 2010 he had abnormal mass in the lung, had a PET scan at [facility], was following with the Oncology clinic and was advised biopsy and probable radiation therapy but he withdrew from the clinic and declined to have any follow-up done.   PAST MEDICAL HISTORY:  1. Radiation treatment and then later laryngectomy in December 2008 at [facility]. 2. Appendectomy.  3. Prostate laser surgery. 4. Hypertension.   SOCIAL HISTORY: Long smoking history and long alcohol use.   ALLERGIES: No known allergies.   MEDICATIONS: Lisinopril and another blood pressure medicine.   FAMILY HISTORY: Negative for malignancy except one aunt with some type of cancer.   SYSTEM REVIEW: He has general weakness. There has been weight loss. He complained of pain in the neck on the left side that he says started about a year after his surgery and has been present for years, unchanged, some waxing and waning. He was not short of breath. He had  no retrosternal chest pain. He had no abdominal pain. He had no bruising. He has general weakness. There is no nausea, vomiting, diarrhea, or blood in stools. No productive cough.   PHYSICAL EXAMINATION:   GENERAL: He is alert and cooperative, appeared chronically ill, cachectic, alert.  NEUROLOGIC: Grossly nonfocal. Generally very weak. He did help [place] to position in the bed.   NECK: No mass. He has a trach ostomy.   LUNGS: Normal respiratory effort, clear with decreased air entry but no wheezing or rales.   HEART: Regular.   ABDOMEN: Nontender. No palpable mass or organomegaly.   EXTREMITIES: Dry skin and some fungal infection in the nails and feet.   LYMPH NODES: Not palpable in the neck or axilla.   LABS ON ADMISSION: White count 9.3, hemoglobin 11, hematocrit 32, platelets 168, creatinine 2.07. Urine had white cells and positive esterase. Chest x-ray had abnormal area in the lung. CT shows 9 mm, 4 mm, 3 mm, and another 3 mm subpleural nodule in the lung in the left upper lobe, spiculated mass of 5.4 x 3.4 cm with some cavitation.   IMPRESSION AND PLAN: The patient has generalized weakness. He has failure to thrive. He has dehydration and now likely prerenal azotemia. He has anemia that is likely due to malignancy and chronic disease. He could have iron deficiency or other underlying contributors to anemia. Furthermore with hydration we may unmask more anemia. He also has a urine infection so the anemia of acute infection  as well. He has hypertension that is controlled. He has a history of alcohol and tobacco abuse but not drinking recently. He has left upper lobe mass and history revealed that he had an abnormality here, goes back a few years, and appears to be enlarging plus appears to have small new lesions in the opposite lung which are suspicious for metastatic disease. He has some chronic low back pain for years, not changed in character or location. He may have metastatic squamous  larynx cancer but I think he more likely has a second lung primary and high likelihood that this is metastatic disease as well. He is not a good candidate for nor does he desire any aggressive surgery. He has not been interested in any follow-up evaluations but now after discussion with his family he is interested in pursuing a PET scan and depending on the findings would consider palliative radiation and also biopsy. He understands I also discussed with his son that treatment options would be palliative radiation to lung or bone or other sites, and palliative radiation to the lung, and possibility that if he had particular adenocarcinoma with particular molecular profile then he would be a candidate for oral targeted agents which can control cancer with much less morbidity than traditional chemotherapy. The patient is a very poor candidate for and would not desire any aggressive standard chemotherapy. Therefore, will look at the PET scan to determine extent of disease and gauge the patient's interest in a biopsy which he understands also has risks that include, but not limited to, pneumothorax. Otherwise, would pursue palliative or Hospice care.   ____________________________ Simonne Come. Inez Pilgrim, MD rgg:drc D: 05/20/2011 20:14:46 ET T: 05/21/2011 11:26:34 ET JOB#: [id number]  cc: Simonne Come. Inez Pilgrim, MD, <Dictator> Dallas Schimke MD ELECTRONICALLY SIGNED 06/14/2011 13:51

## 2014-08-11 NOTE — Consult Note (Signed)
Comments   [facility] Oncology notes obtained and reviewed. Pt with progression of LUL mass (1.3 x 1.1 cm) on repeat PET in 01/2009. At this time he was scheduled for follow up with plan to pursue biopsy with possible VATS resection vs radiotherapy. This was discussed with pt by Dr Ricky Ala on 01/30/2009 which was the last clinic date. I called and spoke with Marylyn Ishihara, RN who is Dr Derinda Sis nurse. Per her notes, last contact was made via phone on 04/2010 at which time patient again refused any workup of lung mass and did not want to make another clinic appointment. Information relayed to Dr Cynda Acres.  with pt's son, Zenia Resides. He understands pt's clinical status with likely dx of lung malignancy after having met with Dr Cynda Acres. Plan is for PET scan and then evaluate treatment options. Son seems quite realistic with pt being unable to return to home after discharge. Family has been trying to get pt to agree to ALF for quite some time. Son understands pt may not improve his functional status and may need LTC. PT eval is pending. Son is in agreement with DNR. All questions answered. Will continue to follow.  35 minutes  Electronic Signatures: Ciji Boston, Kirt Boys (NP)  (Signed 31-Jan-13 17:10)  Authored: Palliative Care Phifer, Izora Gala (MD)  (Signed 31-Jan-13 17:23)  Authored: Palliative Care   Last Updated: 31-Jan-13 17:23 by Phifer, Izora Gala (MD)
